# Patient Record
Sex: Female | Born: 1990 | Race: White | Hispanic: No | Marital: Married | Smoking: Never smoker
Health system: Southern US, Community
[De-identification: ages and names within clinical notes are randomized; demographics above are authoritative.]

## PROBLEM LIST (undated history)

## (undated) DIAGNOSIS — Z3400 Encounter for supervision of normal first pregnancy, unspecified trimester: Secondary | ICD-10-CM

## (undated) DIAGNOSIS — Z98891 History of uterine scar from previous surgery: Secondary | ICD-10-CM

---
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
## 2011-04-03 ENCOUNTER — Emergency Department (HOSPITAL_COMMUNITY)
Admission: EM | Admit: 2011-04-03 | Discharge: 2011-04-03 | Disposition: A | Discharge status: Home / Self Care | Payer: Self-pay | Attending: Emergency Medicine | Admitting: Emergency Medicine

## 2011-04-03 DIAGNOSIS — M546 Pain in thoracic spine: Secondary | ICD-10-CM | POA: Insufficient documentation

## 2011-04-03 DIAGNOSIS — R21 Rash and other nonspecific skin eruption: Secondary | ICD-10-CM | POA: Insufficient documentation

## 2013-12-07 LAB — OB RESULTS CONSOLE GC/CHLAMYDIA
Chlamydia: NEGATIVE
Gonorrhea: NEGATIVE

## 2013-12-07 LAB — OB RESULTS CONSOLE RUBELLA ANTIBODY, IGM: Rubella: NON-IMMUNE/NOT IMMUNE

## 2013-12-07 LAB — OB RESULTS CONSOLE HEPATITIS B SURFACE ANTIGEN: HEP B S AG: NEGATIVE

## 2013-12-07 LAB — OB RESULTS CONSOLE ANTIBODY SCREEN: ANTIBODY SCREEN: NEGATIVE

## 2013-12-07 LAB — OB RESULTS CONSOLE ABO/RH: RH Type: NEGATIVE

## 2013-12-07 LAB — OB RESULTS CONSOLE RPR: RPR: NONREACTIVE

## 2013-12-07 LAB — OB RESULTS CONSOLE HIV ANTIBODY (ROUTINE TESTING): HIV: NONREACTIVE

## 2014-06-01 LAB — OB RESULTS CONSOLE GBS: STREP GROUP B AG: POSITIVE

## 2014-06-25 ENCOUNTER — Inpatient Hospital Stay (HOSPITAL_COMMUNITY): Admission: AD | Admit: 2014-06-25 | Payer: Self-pay | Source: Ambulatory Visit | Admitting: Obstetrics and Gynecology

## 2014-06-28 ENCOUNTER — Encounter (HOSPITAL_COMMUNITY): Payer: Self-pay | Admitting: *Deleted

## 2014-06-28 ENCOUNTER — Telehealth (HOSPITAL_COMMUNITY): Payer: Self-pay | Admitting: *Deleted

## 2014-06-28 NOTE — Telephone Encounter (Signed)
Preadmission screen  

## 2014-06-29 ENCOUNTER — Encounter (HOSPITAL_COMMUNITY): Payer: Self-pay

## 2014-06-29 DIAGNOSIS — Z3400 Encounter for supervision of normal first pregnancy, unspecified trimester: Secondary | ICD-10-CM

## 2014-06-29 HISTORY — DX: Encounter for supervision of normal first pregnancy, unspecified trimester: Z34.00

## 2014-06-29 NOTE — H&P (Signed)
Anna Oconnell is a 23 y.o. female G1P0 at 55+ for IOL given post-term.  Relatively uncomplicated prenatal care except +GBBS.  +FM, no LOF, no VB, occ ctx; Rh negative - has got Rhogam.  D/w pt r/b/a of IOL, will proceed.     Maternal Medical History:  Contractions: Frequency: irregular.    Fetal activity: Perceived fetal activity is normal.    Prenatal complications: no prenatal complications Prenatal Complications - Diabetes: none.    OB History   Grav Para Term Preterm Abortions TAB SAB Ect Mult Living   1             G1 present No abn pap No STD (oral HSV)   Past Medical History  Diagnosis Date  . Normal pregnancy, first 06/29/2014   No past surgical history on file. Family History: family history is not on file. Social History:  has no tobacco, alcohol, and drug history on file.married, Art therapist Meds PNV All NKDA   Prenatal Transfer Tool  Maternal Diabetes: No Genetic Screening: Normal Maternal Ultrasounds/Referrals: Normal Fetal Ultrasounds or other Referrals:  None Maternal Substance Abuse:  No Significant Maternal Medications:  None Significant Maternal Lab Results:  Lab values include: Group B Strep positive Other Comments:  None  Review of Systems  Constitutional: Negative.   HENT: Negative.   Eyes: Negative.   Respiratory: Negative.   Cardiovascular: Negative.   Gastrointestinal: Negative.   Genitourinary: Negative.   Musculoskeletal: Negative.   Skin: Negative.   Neurological: Negative.   Psychiatric/Behavioral: Negative.       Last menstrual period 09/18/2013. Maternal Exam:  Abdomen: Fundal height is appropriate for gestation.   Estimated fetal weight is 6.5-7.5#.   Fetal presentation: vertex  Introitus: Normal vulva. Normal vagina.  Pelvis: adequate for delivery.   Cervix: Cervix evaluated by digital exam.     Physical Exam  Constitutional: She is oriented to person, place, and time. She appears well-developed and  well-nourished.  HENT:  Head: Normocephalic and atraumatic.  Cardiovascular: Normal rate and regular rhythm.   Respiratory: Effort normal and breath sounds normal. No respiratory distress. She has no wheezes.  GI: Soft. Bowel sounds are normal. She exhibits no distension. There is no tenderness.  Musculoskeletal: Normal range of motion.  Neurological: She is alert and oriented to person, place, and time.  Skin: Skin is warm and dry.  Psychiatric: She has a normal mood and affect. Her behavior is normal.    Prenatal labs: ABO, Rh: O/Negative/-- (01/27 0000) Antibody: Negative (01/27 0000) Rubella: Nonimmune (01/27 0000) RPR: Nonreactive (01/27 0000)  HBsAg: Negative (01/27 0000)  HIV: Non-reactive (01/27 0000)  GBS: Positive (07/22 0000)   Rhogam and Tdap 03/31/2014  Hgb 12.4/CF neg/Ur Cx neg/GC and Chl neg/Panorama LOW RISK, nl 1st tri [place] and AFP/glucola 125  [place] First tri cwd LMP Nl anat, ant plac, female  SVE 2/50/-2 Assessment/Plan: 23yo G1P0 at 66+  IOL with AROM after PCN and pitocin RNI - MMR PP Expect SVD  Bovard-Stuckert, Anna Oconnell 06/29/2014, 9:49 PM

## 2014-06-30 ENCOUNTER — Encounter (HOSPITAL_COMMUNITY): Payer: BC Managed Care – PPO | Admitting: Anesthesiology

## 2014-06-30 ENCOUNTER — Inpatient Hospital Stay (HOSPITAL_COMMUNITY)
Admission: RE | Admit: 2014-06-30 | Discharge: 2014-07-03 | DRG: 766 | Disposition: A | Discharge status: Home / Self Care | Payer: BC Managed Care – PPO | Source: Ambulatory Visit | Attending: Obstetrics and Gynecology | Admitting: Obstetrics and Gynecology

## 2014-06-30 ENCOUNTER — Inpatient Hospital Stay (HOSPITAL_COMMUNITY): Payer: BC Managed Care – PPO | Admitting: Anesthesiology

## 2014-06-30 ENCOUNTER — Encounter (HOSPITAL_COMMUNITY): Payer: Self-pay

## 2014-06-30 VITALS — BP 107/66 | HR 79 | Temp 98.3°F | Resp 18 | Ht 63.0 in | Wt 138.0 lb

## 2014-06-30 DIAGNOSIS — Z22330 Carrier of Group B streptococcus: Secondary | ICD-10-CM | POA: Diagnosis not present

## 2014-06-30 DIAGNOSIS — Z98891 History of uterine scar from previous surgery: Secondary | ICD-10-CM

## 2014-06-30 DIAGNOSIS — O9989 Other specified diseases and conditions complicating pregnancy, childbirth and the puerperium: Secondary | ICD-10-CM

## 2014-06-30 DIAGNOSIS — Z3403 Encounter for supervision of normal first pregnancy, third trimester: Secondary | ICD-10-CM

## 2014-06-30 DIAGNOSIS — O480 Post-term pregnancy: Principal | ICD-10-CM | POA: Diagnosis present

## 2014-06-30 DIAGNOSIS — O99892 Other specified diseases and conditions complicating childbirth: Secondary | ICD-10-CM | POA: Diagnosis present

## 2014-06-30 DIAGNOSIS — O360990 Maternal care for other rhesus isoimmunization, unspecified trimester, not applicable or unspecified: Secondary | ICD-10-CM | POA: Diagnosis present

## 2014-06-30 HISTORY — DX: Encounter for supervision of normal first pregnancy, unspecified trimester: Z34.00

## 2014-06-30 HISTORY — DX: History of uterine scar from previous surgery: Z98.891

## 2014-06-30 LAB — CBC
HEMATOCRIT: 38.5 % (ref 36.0–46.0)
Hemoglobin: 13.1 g/dL (ref 12.0–15.0)
MCH: 29.2 pg (ref 26.0–34.0)
MCHC: 34 g/dL (ref 30.0–36.0)
MCV: 85.9 fL (ref 78.0–100.0)
Platelets: 143 10*3/uL — ABNORMAL LOW (ref 150–400)
RBC: 4.48 MIL/uL (ref 3.87–5.11)
RDW: 13.9 % (ref 11.5–15.5)
WBC: 12 10*3/uL — ABNORMAL HIGH (ref 4.0–10.5)

## 2014-06-30 LAB — RPR

## 2014-06-30 MED ORDER — PHENYLEPHRINE 40 MCG/ML (10ML) SYRINGE FOR IV PUSH (FOR BLOOD PRESSURE SUPPORT): 80.0000 ug | PREFILLED_SYRINGE | INTRAVENOUS | Status: DC | PRN

## 2014-06-30 MED ORDER — LIDOCAINE HCL (PF) 1 % IJ SOLN: 30.0000 mL | INTRAMUSCULAR | Status: DC | PRN

## 2014-06-30 MED ORDER — EPHEDRINE 5 MG/ML INJ: 10.0000 mg | INTRAVENOUS | Status: DC | PRN

## 2014-06-30 MED ORDER — LACTATED RINGERS IV SOLN
INTRAVENOUS | Status: DC
  Administered 2014-06-30: 1000 mL via INTRAVENOUS
  Administered 2014-06-30 – 2014-07-01 (×3): via INTRAVENOUS

## 2014-06-30 MED ORDER — ACETAMINOPHEN 325 MG PO TABS
650.0000 mg | ORAL_TABLET | ORAL | Status: DC | PRN
  Administered 2014-06-30: 650 mg via ORAL
  Filled 2014-06-30: qty 2

## 2014-06-30 MED ORDER — BUTORPHANOL TARTRATE 1 MG/ML IJ SOLN: 1.0000 mg | INTRAMUSCULAR | Status: DC | PRN

## 2014-06-30 MED ORDER — FENTANYL 2.5 MCG/ML BUPIVACAINE 1/10 % EPIDURAL INFUSION (WH - ANES)
INTRAMUSCULAR | Status: DC | PRN
  Administered 2014-06-30: 13 mL/h via EPIDURAL

## 2014-06-30 MED ORDER — TERBUTALINE SULFATE 1 MG/ML IJ SOLN: 0.2500 mg | Freq: Once | INTRAMUSCULAR | Status: AC | PRN

## 2014-06-30 MED ORDER — PENICILLIN G POTASSIUM 5000000 UNITS IJ SOLR
2.5000 10*6.[IU] | INTRAVENOUS | Status: DC
  Administered 2014-06-30 (×3): 2.5 10*6.[IU] via INTRAVENOUS
  Filled 2014-06-30 (×5): qty 2.5

## 2014-06-30 MED ORDER — LIDOCAINE HCL (PF) 1 % IJ SOLN
INTRAMUSCULAR | Status: DC | PRN
  Administered 2014-06-30 (×2): 4 mL

## 2014-06-30 MED ORDER — LACTATED RINGERS IV SOLN: 500.0000 mL | Freq: Once | INTRAVENOUS | Status: DC

## 2014-06-30 MED ORDER — OXYTOCIN 40 UNITS IN LACTATED RINGERS INFUSION - SIMPLE MED: 62.5000 mL/h | INTRAVENOUS | Status: DC

## 2014-06-30 MED ORDER — GENTAMICIN SULFATE 40 MG/ML IJ SOLN
150.0000 mg | Freq: Three times a day (TID) | INTRAVENOUS | Status: DC
  Administered 2014-06-30: 150 mg via INTRAVENOUS
  Filled 2014-06-30 (×3): qty 3.75

## 2014-06-30 MED ORDER — FENTANYL 2.5 MCG/ML BUPIVACAINE 1/10 % EPIDURAL INFUSION (WH - ANES)
INTRAMUSCULAR | Status: AC
  Administered 2014-06-30: 12:00:00
  Filled 2014-06-30: qty 125

## 2014-06-30 MED ORDER — OXYTOCIN 40 UNITS IN LACTATED RINGERS INFUSION - SIMPLE MED
1.0000 m[IU]/min | INTRAVENOUS | Status: DC
  Administered 2014-06-30: 8 m[IU]/min via INTRAVENOUS
  Administered 2014-06-30: 4 m[IU]/min via INTRAVENOUS
  Administered 2014-06-30: 6 m[IU]/min via INTRAVENOUS
  Administered 2014-06-30: 2 m[IU]/min via INTRAVENOUS
  Administered 2014-06-30: 10 m[IU]/min via INTRAVENOUS
  Filled 2014-06-30: qty 1000

## 2014-06-30 MED ORDER — OXYCODONE-ACETAMINOPHEN 5-325 MG PO TABS: 1.0000 | ORAL_TABLET | ORAL | Status: DC | PRN

## 2014-06-30 MED ORDER — OXYTOCIN BOLUS FROM INFUSION: 500.0000 mL | INTRAVENOUS | Status: DC

## 2014-06-30 MED ORDER — PHENYLEPHRINE 40 MCG/ML (10ML) SYRINGE FOR IV PUSH (FOR BLOOD PRESSURE SUPPORT)
PREFILLED_SYRINGE | INTRAVENOUS | Status: AC
  Filled 2014-06-30: qty 10

## 2014-06-30 MED ORDER — PENICILLIN G POTASSIUM 5000000 UNITS IJ SOLR
5.0000 10*6.[IU] | Freq: Once | INTRAVENOUS | Status: AC
  Administered 2014-06-30: 5 10*6.[IU] via INTRAVENOUS
  Filled 2014-06-30: qty 5

## 2014-06-30 MED ORDER — SODIUM CHLORIDE 0.9 % IV SOLN
2.0000 g | Freq: Four times a day (QID) | INTRAVENOUS | Status: DC
  Administered 2014-06-30 – 2014-07-01 (×2): 2 g via INTRAVENOUS
  Filled 2014-06-30 (×4): qty 2000

## 2014-06-30 MED ORDER — LACTATED RINGERS IV SOLN
500.0000 mL | INTRAVENOUS | Status: DC | PRN
  Administered 2014-06-30 (×2): 300 mL via INTRAVENOUS

## 2014-06-30 MED ORDER — CITRIC ACID-SODIUM CITRATE 334-500 MG/5ML PO SOLN
30.0000 mL | ORAL | Status: DC | PRN
  Administered 2014-07-01 (×2): 30 mL via ORAL
  Filled 2014-06-30: qty 15

## 2014-06-30 MED ORDER — ONDANSETRON HCL 4 MG/2ML IJ SOLN: 4.0000 mg | Freq: Four times a day (QID) | INTRAMUSCULAR | Status: DC | PRN

## 2014-06-30 MED ORDER — FENTANYL 2.5 MCG/ML BUPIVACAINE 1/10 % EPIDURAL INFUSION (WH - ANES)
14.0000 mL/h | INTRAMUSCULAR | Status: DC | PRN
  Administered 2014-07-01: 14 mL/h via EPIDURAL
  Filled 2014-06-30 (×2): qty 125

## 2014-06-30 MED ORDER — DIPHENHYDRAMINE HCL 50 MG/ML IJ SOLN: 12.5000 mg | INTRAMUSCULAR | Status: DC | PRN

## 2014-06-30 MED ORDER — IBUPROFEN 600 MG PO TABS: 600.0000 mg | ORAL_TABLET | Freq: Four times a day (QID) | ORAL | Status: DC | PRN

## 2014-06-30 NOTE — Progress Notes (Signed)
Patient ID: Anna Oconnell, female   DOB: 1991/04/18, 23 y.o.   MRN: [id number]  Comfortable with epidural  AFVSS gen NAD FHTs 140's, category 1, good var toco q 2-93min  SVE 3.4/50/-2 AROM for light meconium, d/w pt  Continue IOL

## 2014-06-30 NOTE — Progress Notes (Signed)
Patient ID: Anna Oconnell, female   DOB: August 27, 1991, 23 y.o.   MRN: [id number]  Doing well.    AFVSS gen NAD  FHTs 150 mod var, category 1 toco irr  Await AROM for PCN IOL given postterm

## 2014-06-30 NOTE — Anesthesia Procedure Notes (Signed)
Epidural Patient location during procedure: OB Start time: 06/30/2014 12:24 PM  Staffing Anesthesiologist: Greta Yung A. Performed by: anesthesiologist   Preanesthetic Checklist Completed: patient identified, site marked, surgical consent, pre-op evaluation, timeout performed, IV checked, risks and benefits discussed and monitors and equipment checked  Epidural Patient position: sitting Prep: site prepped and draped and DuraPrep Patient monitoring: continuous pulse ox and blood pressure Approach: midline Location: L3-L4 Injection technique: LOR air  Needle:  Needle type: Tuohy  Needle gauge: 17 G Needle length: 9 cm and 9 Needle insertion depth: 4 cm Catheter type: closed end flexible Catheter size: 19 Gauge Catheter at skin depth: 9 cm Test dose: negative and Other  Assessment Events: blood not aspirated, injection not painful, no injection resistance, negative IV test and no paresthesia  Additional Notes Patient identified. Risks and benefits discussed including failed block, incomplete  Pain control, post dural puncture headache, nerve damage, paralysis, blood pressure Changes, nausea, vomiting, reactions to medications-both toxic and allergic and post Partum back pain. All questions were answered. Patient expressed understanding and wished to proceed. Sterile technique was used throughout procedure. Epidural site was Dressed with sterile barrier dressing. No paresthesias, signs of intravascular injection Or signs of intrathecal spread were encountered.  Patient was more comfortable after the epidural was dosed. Please see RN's note for documentation of vital signs and FHR which are stable.

## 2014-06-30 NOTE — Progress Notes (Signed)
Patient ID: Anna Oconnell, female   DOB: 1991-09-14, 23 y.o.   MRN: [id number]  Comfortable with epidural  AFVSS gen NAD FHTs 145 R, good var, category 1 toco Q 1-min  SVE 6cm per RN  Expect SVD

## 2014-06-30 NOTE — Anesthesia Preprocedure Evaluation (Signed)
Anesthesia Evaluation  Patient identified by MRN, date of birth, ID band Patient awake    Reviewed: Allergy & Precautions, H&P , Patient's Chart, lab work & pertinent test results  Airway Mallampati: II TM Distance: >3 FB Neck ROM: Full    Dental no notable dental hx. (+) Teeth Intact   Pulmonary neg pulmonary ROS,  breath sounds clear to auscultation  Pulmonary exam normal       Cardiovascular negative cardio ROS  Rhythm:Regular Rate:Normal     Neuro/Psych negative neurological ROS  negative psych ROS   GI/Hepatic negative GI ROS, Neg liver ROS,   Endo/Other  negative endocrine ROS  Renal/GU negative Renal ROS  negative genitourinary   Musculoskeletal negative musculoskeletal ROS (+)   Abdominal (+) + obese,   Peds  Hematology negative hematology ROS (+)   Anesthesia Other Findings   Reproductive/Obstetrics (+) Pregnancy                           Anesthesia Physical Anesthesia Plan  ASA: II  Anesthesia Plan: Epidural   Post-op Pain Management:    Induction:   Airway Management Planned: Natural Airway  Additional Equipment:   Intra-op Plan:   Post-operative Plan:   Informed Consent: I have reviewed the patients History and Physical, chart, labs and discussed the procedure including the risks, benefits and alternatives for the proposed anesthesia with the patient or authorized representative who has indicated his/her understanding and acceptance.     Plan Discussed with: Anesthesiologist  Anesthesia Plan Comments:         Anesthesia Quick Evaluation

## 2014-07-01 ENCOUNTER — Encounter (HOSPITAL_COMMUNITY)
Admission: RE | Disposition: A | Discharge status: Home / Self Care | Payer: Self-pay | Source: Ambulatory Visit | Attending: Obstetrics and Gynecology

## 2014-07-01 ENCOUNTER — Encounter (HOSPITAL_COMMUNITY): Payer: Self-pay

## 2014-07-01 DIAGNOSIS — Z98891 History of uterine scar from previous surgery: Secondary | ICD-10-CM

## 2014-07-01 HISTORY — DX: History of uterine scar from previous surgery: Z98.891

## 2014-07-01 LAB — TYPE AND SCREEN
ABO/RH(D): O NEG
ANTIBODY SCREEN: NEGATIVE

## 2014-07-01 LAB — ABO/RH: ABO/RH(D): O NEG

## 2014-07-01 SURGERY — Surgical Case
Anesthesia: Epidural | Site: Abdomen

## 2014-07-01 MED ORDER — ESMOLOL HCL 10 MG/ML IV SOLN
INTRAVENOUS | Status: AC
  Filled 2014-07-01: qty 10

## 2014-07-01 MED ORDER — MEPERIDINE HCL 25 MG/ML IJ SOLN
INTRAMUSCULAR | Status: AC
  Filled 2014-07-01: qty 1

## 2014-07-01 MED ORDER — PROMETHAZINE HCL 25 MG/ML IJ SOLN: 6.2500 mg | INTRAMUSCULAR | Status: DC | PRN

## 2014-07-01 MED ORDER — LACTATED RINGERS IV SOLN: INTRAVENOUS | Status: DC

## 2014-07-01 MED ORDER — PHENYLEPHRINE HCL 10 MG/ML IJ SOLN
INTRAMUSCULAR | Status: DC | PRN
  Administered 2014-07-01: 60 ug via INTRAVENOUS
  Administered 2014-07-01: 40 ug via INTRAVENOUS
  Administered 2014-07-01: 20 ug via INTRAVENOUS

## 2014-07-01 MED ORDER — NALOXONE HCL 1 MG/ML IJ SOLN: 1.0000 ug/kg/h | INTRAVENOUS | Status: DC | PRN

## 2014-07-01 MED ORDER — CHLOROPROCAINE HCL 3 % IJ SOLN
INTRAMUSCULAR | Status: DC | PRN
  Administered 2014-07-01: 600 mg

## 2014-07-01 MED ORDER — KETOROLAC TROMETHAMINE 30 MG/ML IJ SOLN
INTRAMUSCULAR | Status: AC
  Filled 2014-07-01: qty 1

## 2014-07-01 MED ORDER — IBUPROFEN 600 MG PO TABS: 600.0000 mg | ORAL_TABLET | Freq: Four times a day (QID) | ORAL | Status: DC | PRN

## 2014-07-01 MED ORDER — DIPHENHYDRAMINE HCL 50 MG/ML IJ SOLN: 12.5000 mg | INTRAMUSCULAR | Status: DC | PRN

## 2014-07-01 MED ORDER — SCOPOLAMINE 1 MG/3DAYS TD PT72
MEDICATED_PATCH | TRANSDERMAL | Status: AC
  Filled 2014-07-01: qty 1

## 2014-07-01 MED ORDER — SIMETHICONE 80 MG PO CHEW
80.0000 mg | CHEWABLE_TABLET | ORAL | Status: DC
  Administered 2014-07-01 – 2014-07-02 (×2): 80 mg via ORAL
  Filled 2014-07-01 (×2): qty 1

## 2014-07-01 MED ORDER — SODIUM BICARBONATE 8.4 % IV SOLN
INTRAVENOUS | Status: DC | PRN
  Administered 2014-07-01 (×2): 10 mL via EPIDURAL

## 2014-07-01 MED ORDER — MORPHINE SULFATE (PF) 0.5 MG/ML IJ SOLN
INTRAMUSCULAR | Status: DC | PRN
  Administered 2014-07-01: 350 ug via EPIDURAL
  Administered 2014-07-01: 150 ug via INTRAVENOUS

## 2014-07-01 MED ORDER — SIMETHICONE 80 MG PO CHEW
80.0000 mg | CHEWABLE_TABLET | Freq: Three times a day (TID) | ORAL | Status: DC
  Administered 2014-07-01 – 2014-07-03 (×8): 80 mg via ORAL
  Filled 2014-07-01 (×8): qty 1

## 2014-07-01 MED ORDER — SIMETHICONE 80 MG PO CHEW: 80.0000 mg | CHEWABLE_TABLET | ORAL | Status: DC | PRN

## 2014-07-01 MED ORDER — OXYCODONE-ACETAMINOPHEN 5-325 MG PO TABS
1.0000 | ORAL_TABLET | ORAL | Status: DC | PRN
  Administered 2014-07-02 – 2014-07-03 (×5): 1 via ORAL
  Filled 2014-07-01 (×5): qty 1

## 2014-07-01 MED ORDER — OXYTOCIN 10 UNIT/ML IJ SOLN
INTRAMUSCULAR | Status: AC
  Filled 2014-07-01: qty 4

## 2014-07-01 MED ORDER — MEPERIDINE HCL 25 MG/ML IJ SOLN
INTRAMUSCULAR | Status: DC | PRN
  Administered 2014-07-01: 25 mg via INTRAVENOUS

## 2014-07-01 MED ORDER — ONDANSETRON HCL 4 MG/2ML IJ SOLN
INTRAMUSCULAR | Status: DC | PRN
  Administered 2014-07-01: 4 mg via INTRAVENOUS

## 2014-07-01 MED ORDER — MEPERIDINE HCL 25 MG/ML IJ SOLN: 6.2500 mg | INTRAMUSCULAR | Status: DC | PRN

## 2014-07-01 MED ORDER — SENNOSIDES-DOCUSATE SODIUM 8.6-50 MG PO TABS
2.0000 | ORAL_TABLET | ORAL | Status: DC
  Administered 2014-07-01 – 2014-07-02 (×2): 2 via ORAL
  Filled 2014-07-01 (×2): qty 2

## 2014-07-01 MED ORDER — OXYTOCIN 10 UNIT/ML IJ SOLN
40.0000 [IU] | INTRAMUSCULAR | Status: DC | PRN
  Administered 2014-07-01: 40 [IU] via INTRAVENOUS

## 2014-07-01 MED ORDER — DIPHENHYDRAMINE HCL 50 MG/ML IJ SOLN: 25.0000 mg | INTRAMUSCULAR | Status: DC | PRN

## 2014-07-01 MED ORDER — MEASLES, MUMPS & RUBELLA VAC ~~LOC~~ INJ
0.5000 mL | INJECTION | Freq: Once | SUBCUTANEOUS | Status: AC
  Administered 2014-07-03: 0.5 mL via SUBCUTANEOUS
  Filled 2014-07-01: qty 0.5

## 2014-07-01 MED ORDER — PHENYLEPHRINE 8 MG IN D5W 100 ML (0.08MG/ML) PREMIX OPTIME
INJECTION | INTRAVENOUS | Status: AC
  Filled 2014-07-01: qty 100

## 2014-07-01 MED ORDER — LANOLIN HYDROUS EX OINT: 1.0000 "application " | TOPICAL_OINTMENT | CUTANEOUS | Status: DC | PRN

## 2014-07-01 MED ORDER — SCOPOLAMINE 1 MG/3DAYS TD PT72
1.0000 | MEDICATED_PATCH | Freq: Once | TRANSDERMAL | Status: DC
  Administered 2014-07-01: 1.5 mg via TRANSDERMAL

## 2014-07-01 MED ORDER — IBUPROFEN 800 MG PO TABS
800.0000 mg | ORAL_TABLET | Freq: Three times a day (TID) | ORAL | Status: DC
  Administered 2014-07-01 – 2014-07-03 (×6): 800 mg via ORAL
  Filled 2014-07-01 (×8): qty 1

## 2014-07-01 MED ORDER — ONDANSETRON HCL 4 MG PO TABS
4.0000 mg | ORAL_TABLET | ORAL | Status: DC | PRN
Start: 2014-07-01 — End: 2014-07-03

## 2014-07-01 MED ORDER — DIBUCAINE 1 % RE OINT
1.0000 | TOPICAL_OINTMENT | RECTAL | Status: DC | PRN
Start: 2014-07-01 — End: 2014-07-03

## 2014-07-01 MED ORDER — NALBUPHINE HCL 10 MG/ML IJ SOLN
5.0000 mg | INTRAMUSCULAR | Status: DC | PRN
  Administered 2014-07-01: 10 mg via SUBCUTANEOUS
  Filled 2014-07-01: qty 1

## 2014-07-01 MED ORDER — ACETAMINOPHEN 500 MG PO TABS
1000.0000 mg | ORAL_TABLET | Freq: Four times a day (QID) | ORAL | Status: AC
  Administered 2014-07-01 (×4): 1000 mg via ORAL
  Filled 2014-07-01 (×4): qty 2

## 2014-07-01 MED ORDER — MIDAZOLAM HCL 2 MG/2ML IJ SOLN: 0.5000 mg | Freq: Once | INTRAMUSCULAR | Status: DC | PRN

## 2014-07-01 MED ORDER — FENTANYL CITRATE 0.05 MG/ML IJ SOLN
INTRAMUSCULAR | Status: AC
  Filled 2014-07-01: qty 2

## 2014-07-01 MED ORDER — ARTIFICIAL TEARS OP OINT
TOPICAL_OINTMENT | OPHTHALMIC | Status: AC
  Filled 2014-07-01: qty 3.5

## 2014-07-01 MED ORDER — PRENATAL MULTIVITAMIN CH
1.0000 | ORAL_TABLET | Freq: Every day | ORAL | Status: DC
  Administered 2014-07-01 – 2014-07-03 (×3): 1 via ORAL
  Filled 2014-07-01 (×3): qty 1

## 2014-07-01 MED ORDER — LIDOCAINE-EPINEPHRINE (PF) 2 %-1:200000 IJ SOLN
INTRAMUSCULAR | Status: AC
  Filled 2014-07-01: qty 20

## 2014-07-01 MED ORDER — ONDANSETRON HCL 4 MG/2ML IJ SOLN: 4.0000 mg | Freq: Three times a day (TID) | INTRAMUSCULAR | Status: DC | PRN

## 2014-07-01 MED ORDER — MORPHINE SULFATE 0.5 MG/ML IJ SOLN
INTRAMUSCULAR | Status: AC
  Filled 2014-07-01: qty 10

## 2014-07-01 MED ORDER — ZOLPIDEM TARTRATE 5 MG PO TABS: 5.0000 mg | ORAL_TABLET | Freq: Every evening | ORAL | Status: DC | PRN

## 2014-07-01 MED ORDER — DIPHENHYDRAMINE HCL 25 MG PO CAPS: 25.0000 mg | ORAL_CAPSULE | Freq: Four times a day (QID) | ORAL | Status: DC | PRN

## 2014-07-01 MED ORDER — METOCLOPRAMIDE HCL 5 MG/ML IJ SOLN: 10.0000 mg | Freq: Three times a day (TID) | INTRAMUSCULAR | Status: DC | PRN

## 2014-07-01 MED ORDER — SODIUM CHLORIDE 0.9 % IJ SOLN: 3.0000 mL | INTRAMUSCULAR | Status: DC | PRN

## 2014-07-01 MED ORDER — KETOROLAC TROMETHAMINE 30 MG/ML IJ SOLN
30.0000 mg | Freq: Four times a day (QID) | INTRAMUSCULAR | Status: AC | PRN
  Administered 2014-07-01: 30 mg via INTRAMUSCULAR

## 2014-07-01 MED ORDER — WITCH HAZEL-GLYCERIN EX PADS: 1.0000 "application " | MEDICATED_PAD | CUTANEOUS | Status: DC | PRN

## 2014-07-01 MED ORDER — CHLOROPROCAINE HCL 3 % IJ SOLN
INTRAMUSCULAR | Status: AC
  Filled 2014-07-01: qty 20

## 2014-07-01 MED ORDER — FENTANYL CITRATE 0.05 MG/ML IJ SOLN
INTRAMUSCULAR | Status: DC | PRN
  Administered 2014-07-01: 100 ug via INTRAVENOUS

## 2014-07-01 MED ORDER — KETOROLAC TROMETHAMINE 30 MG/ML IJ SOLN: 30.0000 mg | Freq: Four times a day (QID) | INTRAMUSCULAR | Status: AC | PRN

## 2014-07-01 MED ORDER — DIPHENHYDRAMINE HCL 25 MG PO CAPS: 25.0000 mg | ORAL_CAPSULE | ORAL | Status: DC | PRN

## 2014-07-01 MED ORDER — ONDANSETRON HCL 4 MG/2ML IJ SOLN: 4.0000 mg | INTRAMUSCULAR | Status: DC | PRN

## 2014-07-01 MED ORDER — CEFAZOLIN SODIUM-DEXTROSE 2-3 GM-% IV SOLR
2.0000 g | INTRAVENOUS | Status: AC
  Administered 2014-07-01: 2 g via INTRAVENOUS
  Filled 2014-07-01: qty 50

## 2014-07-01 MED ORDER — ONDANSETRON HCL 4 MG/2ML IJ SOLN
INTRAMUSCULAR | Status: AC
  Filled 2014-07-01: qty 2

## 2014-07-01 MED ORDER — OXYTOCIN 40 UNITS IN LACTATED RINGERS INFUSION - SIMPLE MED: 62.5000 mL/h | INTRAVENOUS | Status: AC

## 2014-07-01 MED ORDER — MENTHOL 3 MG MT LOZG: 1.0000 | LOZENGE | OROMUCOSAL | Status: DC | PRN

## 2014-07-01 MED ORDER — NALOXONE HCL 0.4 MG/ML IJ SOLN: 0.4000 mg | INTRAMUSCULAR | Status: DC | PRN

## 2014-07-01 MED ORDER — FENTANYL CITRATE 0.05 MG/ML IJ SOLN: 25.0000 ug | INTRAMUSCULAR | Status: DC | PRN

## 2014-07-01 MED ORDER — NALBUPHINE HCL 10 MG/ML IJ SOLN: 5.0000 mg | INTRAMUSCULAR | Status: DC | PRN

## 2014-07-01 SURGICAL SUPPLY — 39 items
APL SKNCLS STERI-STRIP NONHPOA (GAUZE/BANDAGES/DRESSINGS) ×1
BENZOIN TINCTURE PRP APPL 2/3 (GAUZE/BANDAGES/DRESSINGS) ×3 IMPLANT
BLADE SURG 10 STRL SS (BLADE) ×6 IMPLANT
CLAMP CORD UMBIL (MISCELLANEOUS) IMPLANT
CLOSURE WOUND 1/2 X4 (GAUZE/BANDAGES/DRESSINGS) ×1
CLOTH BEACON ORANGE TIMEOUT ST (SAFETY) ×3 IMPLANT
CONTAINER PREFILL 10% NBF 15ML (MISCELLANEOUS) IMPLANT
DRAPE LG THREE QUARTER DISP (DRAPES) IMPLANT
DRSG OPSITE POSTOP 4X10 (GAUZE/BANDAGES/DRESSINGS) ×3 IMPLANT
DURAPREP 26ML APPLICATOR (WOUND CARE) ×3 IMPLANT
ELECT REM PT RETURN 9FT ADLT (ELECTROSURGICAL) ×3
ELECTRODE REM PT RTRN 9FT ADLT (ELECTROSURGICAL) ×1 IMPLANT
EXTRACTOR VACUUM BELL STYLE (SUCTIONS) ×6 IMPLANT
EXTRACTOR VACUUM M CUP 4 TUBE (SUCTIONS) ×2 IMPLANT
EXTRACTOR VACUUM M CUP 4' TUBE (SUCTIONS) ×1
GLOVE BIO SURGEON STRL SZ 6.5 (GLOVE) ×2 IMPLANT
GLOVE BIO SURGEONS STRL SZ 6.5 (GLOVE) ×1
GOWN STRL REUS W/TWL LRG LVL3 (GOWN DISPOSABLE) ×6 IMPLANT
KIT ABG SYR 3ML LUER SLIP (SYRINGE) ×3 IMPLANT
NEEDLE HYPO 25X5/8 SAFETYGLIDE (NEEDLE) IMPLANT
NS IRRIG 1000ML POUR BTL (IV SOLUTION) ×3 IMPLANT
PACK C SECTION WH (CUSTOM PROCEDURE TRAY) ×3 IMPLANT
PAD OB MATERNITY 4.3X12.25 (PERSONAL CARE ITEMS) ×3 IMPLANT
RTRCTR C-SECT PINK 25CM LRG (MISCELLANEOUS) ×3 IMPLANT
STAPLER VISISTAT 35W (STAPLE) IMPLANT
STRIP CLOSURE SKIN 1/2X4 (GAUZE/BANDAGES/DRESSINGS) ×1 IMPLANT
SUT MNCRL 0 VIOLET CTX 36 (SUTURE) ×2 IMPLANT
SUT MONOCRYL 0 CTX 36 (SUTURE) ×4
SUT PLAIN 1 NONE 54 (SUTURE) IMPLANT
SUT PLAIN 2 0 XLH (SUTURE) ×3 IMPLANT
SUT VIC AB 0 CT1 27 (SUTURE) ×3
SUT VIC AB 0 CT1 27XBRD ANBCTR (SUTURE) ×1 IMPLANT
SUT VIC AB 2-0 CT1 27 (SUTURE) ×3
SUT VIC AB 2-0 CT1 TAPERPNT 27 (SUTURE) ×1 IMPLANT
SUT VIC AB 4-0 KS 27 (SUTURE) ×3 IMPLANT
SYR BULB IRRIGATION 50ML (SYRINGE) ×3 IMPLANT
TOWEL OR 17X24 6PK STRL BLUE (TOWEL DISPOSABLE) ×3 IMPLANT
TRAY FOLEY CATH 14FR (SET/KITS/TRAYS/PACK) ×3 IMPLANT
WATER STERILE IRR 1000ML POUR (IV SOLUTION) ×3 IMPLANT

## 2014-07-01 NOTE — Transfer of Care (Signed)
Immediate Anesthesia Transfer of Care Note  Patient: Anna Oconnell  Procedure(s) Performed: Procedure(s): CESAREAN SECTION (N/A)  Patient Location: PACU  Anesthesia Type:Epidural  Level of Consciousness: awake, alert  and oriented  Airway & Oxygen Therapy: Patient Spontanous Breathing  Post-op Assessment: Report given to PACU RN and Post -op Vital signs reviewed and stable  Post vital signs: Reviewed and stable  Complications: No apparent anesthesia complications

## 2014-07-01 NOTE — Progress Notes (Signed)
Patient ID: GrenadaBrittany Sillas, female   DOB: 10-Oct-1991, 23 y.o.   MRN: [id number]  D/W pt slow/no change over many hours (8 at 10pm).  AFVSS gen NAD  FHTs 150's mod var, category 1 toco Q 2-413min  SVE 9/90/0-+1 RN also checked  D/W pt r/b/a of LTCS including r/b/a - including, but not limited to bleeding, infection, damage to surrounding organs, injury to infant and trouble healing,  Pt voices understanding wishes to proceed.

## 2014-07-01 NOTE — Lactation Note (Signed)
This note was copied from the chart of Anna [place]adaBrittany Narciso. Lactation Consultation Note  Patient Name: Anna Oconnell Today's Date: 07/01/2014 Reason for consult: Initial assessment Baby 10 hours of life. Mom reports that baby just spit up and baby seems sleepy at breast. Discussed with mom that this is normal. Enc to offer lots of STS and offer breasts with feeding cues. Assisted mom to latch baby in football position to right breast. Mom able to hand express colostrum. Baby is sleepy. Demonstrated waking techniques. LC allowed baby to suckle gloved finger. Baby sleepy, suckles finger gently. Enc mom to place EBM on baby's tongue. Baby opened wide and latched on deeply, suckling rhythmically with a few swallows noted. Baby was related several times. Enc mom to break seal before attempting to re-latch more deeply. Mom given [facility] brochure, aware of OP/BFSG and community resources. Enc mom to feed with cue and at least 8-12 times/24 hours. Enc mom to call out for assistance as needed.   Maternal Data Has patient been taught Hand Expression?: Yes Does the patient have breastfeeding experience prior to this delivery?: No  Feeding Feeding Type: Breast Fed Length of feed: 10 min  LATCH Score/Interventions Latch: Repeated attempts needed to sustain latch, nipple held in mouth throughout feeding, stimulation needed to elicit sucking reflex.  Audible Swallowing: A few with stimulation  Type of Nipple: Everted at rest and after stimulation  Comfort (Breast/Nipple): Soft / non-tender     Hold (Positioning): Assistance needed to correctly position infant at breast and maintain latch. Intervention(s): Breastfeeding basics reviewed;Support Pillows;Position options  LATCH Score: 7  Lactation Tools Discussed/Used     Consult Status Consult Status: Follow-up Date: 07/02/14 Follow-up type: In-patient    Geralynn OchsWILLIARD, Caelum Federici 07/01/2014, 4:04 PM

## 2014-07-01 NOTE — Op Note (Signed)
Anna Oconnell, MESCH NO.:  [id number]  MEDICAL RECORD NO.:  [id number]  LOCATION:  [facility]                          FACILITY:  [facility]  PHYSICIAN:  Sherron Monday, MD        DATE OF BIRTH:  1991/07/24  DATE OF PROCEDURE:  06/30/2014 DATE OF DISCHARGE:                              OPERATIVE REPORT   PREOPERATIVE DIAGNOSIS:  Intrauterine pregnancy at term.  Arrest of dilatation.  POSTOPERATIVE DIAGNOSIS:  Intrauterine pregnancy at term.  Arrest of dilatation, delivered.  PROCEDURE:  Primary low transverse cesarean section.  SURGEON:  Sherron Monday, MD  ASSISTANT:  None.  ANESTHESIA:  Epidural and Nesacaine for local anesthesia.  EBL:  700 mL.  URINE OUTPUT:  950 mL clear urine at the end of the procedure.  IV FLUIDS:  1000 mL.  COMPLICATIONS:  None.  PATHOLOGY:  Placenta to L and D.  FINDINGS:  Viable female infant at 5:40 a.m. with Apgars and weight pending at the time of dictation.  Normal uterus, tubes, and ovaries postpartum.  DESCRIPTION OF PROCEDURE:  After informed consent was reviewed with the patient including risks, benefits, and alternatives of the surgical procedure, she was transported to the OR, where her epidural was dosed and found to be adequate.  She was in supine position with a leftward tilt.  A Pfannenstiel skin incision was made approximately 2 fingerbreadths above the pubic symphysis and carried through the underlying layer of fascia sharply.  The fascia was incised in the midline and the incision was extended laterally with Mayo scissors.  The superior aspect of the fascial incision was grasped with Kocher clamps. The rectus muscles were dissected off both bluntly and sharply.  Midline was easily identified.  The peritoneum was entered bluntly.  The incision was extended superiorly and inferiorly with good visualization of the bladder.  Alexis skin retractor was placed carefully making sure no bowel was entrapped.  The uterus was  inspected, incised in a transverse fashion.  The infant was delivered with some difficulty with the aid of a vacuum.  Nose and mouth were suctioned on the field.  Cord was clamped and cut.  The infant was handed off to the awaiting pediatric staff.  The cord gases were collected and are now pending. The placenta was expressed.  Uterus was cleared of all clot and debris. The uterine incision was closed in 2 layers of 0 Monocryl the first of which was running locked and the second as an imbricating layer.  This was noted to be hemostatic.  The gutters were cleared of all clot and debris.  The peritoneum was reapproximated with 2-0 Vicryl in a running fashion.  The subfascial planes were inspected and found to be hemostatic.  The fascia was closed with 0 Vicryl in a single suture. The subcuticular adipose layer was made hemostatic with Bovie cautery.  The dead space was closed with 3-0 plain gut.  The skin was closed with 4-0 Vicryl on a Keith needle in a subcuticular fashion.  Benzoin and Steri- Strips were applied.  Sponge, lap, and needle count was correct x2 per the operating room staff.  The patient tolerated the procedure well.     Sherron Monday, MD  JB/MEDQ  D:  07/01/2014  T:  07/01/2014  Job:  [id number]

## 2014-07-01 NOTE — Brief Op Note (Signed)
06/30/2014 - 07/01/2014  6:22 AM  PATIENT:  Anna Oconnell  23 y.o. female  PRE-OPERATIVE DIAGNOSIS:  Arrest of dilitation  POST-OPERATIVE DIAGNOSIS:  same  PROCEDURE:  Procedure(s): CESAREAN SECTION (N/A)  SURGEON:  Surgeon(s) and Role:    * Elvera Almario Bovard-Stuckert, MD - Primary  ANESTHESIA:   local and epidural  EBL:  Total I/O In: 1000 [I.V.:1000] Out: 1650 [Urine:950; Blood:700]  FINDINGS: viable female infant at 5:40am apgars P, weight P, nl PP anatomy  BLOOD ADMINISTERED:none  DRAINS: Urinary Catheter (Foley)   LOCAL MEDICATIONS USED:  OTHER Nesicaine  SPECIMEN:  Source of Specimen:  Placenta  DISPOSITION OF SPECIMEN:  L&D  COUNTS:  YES  TOURNIQUET:  * No tourniquets in log *  DICTATION: .Other Dictation: Dictation Number [id number]  PLAN OF CARE: Admit to inpatient   PATIENT DISPOSITION:  PACU - hemodynamically stable.   Delay start of Pharmacological VTE agent (>24hrs) due to surgical blood loss or risk of bleeding: not applicable

## 2014-07-01 NOTE — Progress Notes (Signed)
ANTIBIOTIC CONSULT NOTE - INITIAL  Pharmacy Consult for Gentamicin Indication: Chorioamnionitis   No Known Allergies  Patient Measurements: Height: 5\' 3"  (160 cm) Weight: 138 lb (62.596 kg) IBW/kg (Calculated) : 52.4 Adjusted Body Weight: 55.5  Vital Signs: Temp: 98 F (36.7 C) (08/21 0431) Temp src: Oral (08/21 0431) BP: 127/80 mmHg (08/21 0432) Pulse Rate: 96 (08/21 0432)  Labs:  Recent Labs  06/30/14 0725  WBC 12.0*  HGB 13.1  PLT 143*   No results found for this basename: GENTTROUGH, GENTPEAK, GENTRANDOM,  in the last 72 hours   Microbiology: No results found for this or any previous visit (from the past 720 hour(s)).  Medications:  Ampicillin 2 grams Q 6 hr  Assessment: 23 y.o. female G1P0 at 4088w6d for IOL. +GBS.  Estimated Ke = 0.327, Vd = 0.4  Goal of Therapy:  Gentamicin peak 6-8 mg/L and Trough < 1 mg/L  Plan:   Gentamicin 150 mg IV every 8 hrs  Check Scr with next labs if gentamicin continued. Will check gentamicin levels if continued > 72hr or clinically indicated.  Rockney Grenz Scarlett 07/01/2014,5:00 AM

## 2014-07-01 NOTE — Anesthesia Postprocedure Evaluation (Signed)
  Anesthesia Post-op Note  Patient: Anna Oconnell  Procedure(s) Performed: Procedure(s): CESAREAN SECTION (N/A)  Patient Location: Mother/Baby  Anesthesia Type:Epidural  Level of Consciousness: awake, alert  and oriented  Airway and Oxygen Therapy: Patient Spontanous Breathing  Post-op Pain: none  Post-op Assessment: Post-op Vital signs reviewed, Patient's Cardiovascular Status Stable, Respiratory Function Stable, No headache, No backache, No residual numbness and No residual motor weakness  Post-op Vital Signs: Reviewed and stable  Last Vitals:  Filed Vitals:   07/01/14 1654  BP: 88/48  Pulse: 90  Temp: 36.7 C  Resp: 18    Complications: No apparent anesthesia complications

## 2014-07-02 LAB — CBC
HEMATOCRIT: 31.9 % — AB (ref 36.0–46.0)
Hemoglobin: 10.7 g/dL — ABNORMAL LOW (ref 12.0–15.0)
MCH: 29.2 pg (ref 26.0–34.0)
MCHC: 33.5 g/dL (ref 30.0–36.0)
MCV: 87.2 fL (ref 78.0–100.0)
PLATELETS: 122 10*3/uL — AB (ref 150–400)
RBC: 3.66 MIL/uL — ABNORMAL LOW (ref 3.87–5.11)
RDW: 14.5 % (ref 11.5–15.5)
WBC: 14.3 10*3/uL — ABNORMAL HIGH (ref 4.0–10.5)

## 2014-07-02 LAB — BIRTH TISSUE RECOVERY COLLECTION (PLACENTA DONATION)

## 2014-07-02 NOTE — Progress Notes (Signed)
Patient ID: Anna Oconnell, female   DOB: [birth year], 23 y.o.   MRN: [id number] #1 afebrile BP normal HGB stable No complaints. Tolerating a diet, passing flatus, voiding well and ambulating well.

## 2014-07-03 MED ORDER — MEASLES, MUMPS & RUBELLA VAC ~~LOC~~ INJ
0.5000 mL | INJECTION | Freq: Once | SUBCUTANEOUS | Status: DC
  Filled 2014-07-03: qty 0.5

## 2014-07-03 MED ORDER — OXYCODONE-ACETAMINOPHEN 5-325 MG PO TABS: 1.0000 | ORAL_TABLET | Freq: Four times a day (QID) | ORAL | Status: DC | PRN

## 2014-07-03 MED ORDER — IBUPROFEN 600 MG PO TABS: 600.0000 mg | ORAL_TABLET | Freq: Four times a day (QID) | ORAL | Status: DC | PRN

## 2014-07-03 NOTE — Discharge Summary (Signed)
[name], Anna Oconnell NO.:  [id number]  MEDICAL RECORD NO.:  [id number]  LOCATION:  9102                          [facility]ACILITY:  [facility]  PHYSICIAN:  Malachi Pro. Ambrose Mantle, M.D. DATE OF BIRTH:  [birth year]  DATE OF ADMISSION:  06/30/2014 DATE OF DISCHARGE:  07/03/2014                              DISCHARGE SUMMARY   A 24 year old white female, para 0, gravida 1, at 40+ weeks gestation, admitted for induction of labor.  Blood group and type O negative, negative antibody.  Rubella nonimmune.  RPR nonreactive.  Hepatitis B surface antigen negative, HIV negative, group B strep positive.  Low risk 1st trimester screen.  One-hour Glucola 125.  AFP normal.  After admission to the hospital, the patient was started on Pitocin.  She received an epidural.  At 1 p.m., she was 3 cm to 4 cm, 50%, vertex at - 2.  By 7:40 p.m., she was 6 cm.  At 5:02 a.m. on July 01, 2014, she was 9 cm, possible arrest of dilatation.  She was taken the OR, underwent a low-transverse cervical C-section by Dr. Ellyn Hack.  Delivery of a healthy infant.  Postoperatively, the patient did very well.  Had no particular problems and on the second postop day, she was afebrile, doing well, passing flatus, tolerating a diet, ambulating well, voiding well, and was ready for discharge.  LABORATORY DATA:  Initial hemoglobin 13.1, hematocrit 38.5, white count 12,000, platelet count of 143,000.  Followup hemoglobin 10.7, platelet count of 122,000.  RPR was nonreactive.  FINAL DIAGNOSES:  Intrauterine pregnancy 40+ weeks, delivered by C- section.  Arrest of dilatation.  OPERATION:  Low-transverse cervical C-section.  FINAL CONDITION:  Improved.  INSTRUCTIONS:  Include our regular discharge instruction booklet and after visit summary.  Prescriptions for Motrin 600 mg 30 tablets, 1 every 6 hours as needed for pain and Percocet 5/325, 30 tablets, 1 every 6 hours as needed for pain.  She is to return to the office in 2  weeks for followup examination with Dr. Ellyn Hack.  She is also to schedule the baby circumcision.  She has already paid for it to have it done in the office.  She was given an option of having it done here in the hospital, but she elects to have it done in the office.     Malachi Pro. Ambrose Mantle, M.D.     TFH/MEDQ  D:  07/03/2014  T:  07/03/2014  Job:  [id number]

## 2014-07-03 NOTE — Discharge Instructions (Signed)
booklet °

## 2014-07-03 NOTE — Progress Notes (Signed)
Patient ID: Anna Oconnell, female   DOB: 1991-01-07, 23 y.o.   MRN: [id number] #[id number] afebrile doing well for d/c

## 2014-07-03 NOTE — Lactation Note (Addendum)
This note was copied from the chart of Anna [place] Garcilazo. Lactation Consultation Note  Patient Name: Anna Oconnell [id number] Date: 07/03/2014 Reason for consult: Follow-up assessment Per mom the baby recently fed at the breast. Mom had many Breast feeding questions. LC reviewed basics , prevention and tx of sore nipples and engorgement. Mom has a DEBP Medela and LC set up and instructed. Mom receptive to teaching and review. Instructed on the use comfort gels for tender nipples. Mother informed of post-discharge support and given phone number to the lactation department, including services  for phone call assistance; out-patient appointments; and breastfeeding support group. List of other breastfeeding resources  in the community given in the handout. Encouraged mother to call for problems or concerns related to breastfeeding.   Maternal Data    Feeding Feeding Type:  (per mom recently fed at 1400 ) Length of feed: 15 min  LATCH Score/Interventions       Type of Nipple:  (per mom nipples tender , instructed comfort gels )        Intervention(s): Breastfeeding basics reviewed     Lactation Tools Discussed/Used WIC Program: No Pump Review: Setup, frequency, and cleaning;Milk Storage Initiated by:: MAI  Date initiated:: 07/03/14   Consult Status Consult Status: Complete Date: 07/03/14 Follow-up type: In-patient    Kathrin Greathouse 07/03/2014, 3:13 PM

## 2014-07-04 ENCOUNTER — Encounter (HOSPITAL_COMMUNITY): Payer: Self-pay | Admitting: Obstetrics and Gynecology

## 2014-09-12 ENCOUNTER — Encounter (HOSPITAL_COMMUNITY): Payer: Self-pay | Admitting: Obstetrics and Gynecology

## 2015-09-04 LAB — OB RESULTS CONSOLE ABO/RH: RH Type: NEGATIVE

## 2015-09-04 LAB — OB RESULTS CONSOLE HEPATITIS B SURFACE ANTIGEN: HEP B S AG: NEGATIVE

## 2015-09-04 LAB — OB RESULTS CONSOLE GC/CHLAMYDIA
CHLAMYDIA, DNA PROBE: NEGATIVE
GC PROBE AMP, GENITAL: NEGATIVE

## 2015-09-04 LAB — OB RESULTS CONSOLE HIV ANTIBODY (ROUTINE TESTING): HIV: NONREACTIVE

## 2015-09-04 LAB — OB RESULTS CONSOLE RUBELLA ANTIBODY, IGM: Rubella: IMMUNE

## 2015-09-04 LAB — OB RESULTS CONSOLE ANTIBODY SCREEN: ANTIBODY SCREEN: NEGATIVE

## 2015-11-12 NOTE — L&D Delivery Note (Signed)
Delivery Note Pt reached complete dilation and pushed a little over an hour bringing the vertex to a +3 station.  Despite much coaching, she felt she could not push any longer so was consented for a vacuum assisted delivery.  The risks of vacuum delivery were d/w pt and husband including, cephalohemotoma or other injuries.  With the bladder empty, the kiwi vacuum was applied to LOA presentation +3 station and inflated to green zone.  In one set of pulls the vertex was brought to crowning and delivered.  The remainder of the body delivered without problem.  At 10:00 AM a healthy female was delivered via VBAC, Vacuum Assisted (Presentation: ; Occiput Anterior).  APGAR: 9, 9; weight pending .   Placenta status: Intact, Spontaneous.  Cord: 3 vessels with the following complications: None.  Anesthesia: Epidural  Episiotomy: None Lacerations: First degree;Vaginal and left labial Suture Repair: 3.0 vicryl rapide Est. Blood Loss (mL): 225ml  Mom to postpartum.  Baby to Couplet care / Skin to Skin.  D/w pt BTL process in detail.  We reviewed the procedure and she has decided she does not want to proceed.  Husband plans vasectomy D/w pt and husband circumcision and they desire to proceed.  Oliver PilaRICHARDSON,Anna Oconnell W 03/22/2016, 10:35 AM

## 2016-01-08 LAB — OB RESULTS CONSOLE RPR: RPR: NONREACTIVE

## 2016-02-29 LAB — OB RESULTS CONSOLE GBS: GBS: POSITIVE

## 2016-03-21 ENCOUNTER — Inpatient Hospital Stay (EMERGENCY_DEPARTMENT_HOSPITAL)
Admission: AD | Admit: 2016-03-21 | Discharge: 2016-03-21 | Disposition: A | Discharge status: Home / Self Care | Payer: 59 | Source: Ambulatory Visit | Attending: Obstetrics and Gynecology | Admitting: Obstetrics and Gynecology

## 2016-03-21 ENCOUNTER — Encounter (HOSPITAL_COMMUNITY): Payer: Self-pay

## 2016-03-21 DIAGNOSIS — O26893 Other specified pregnancy related conditions, third trimester: Secondary | ICD-10-CM

## 2016-03-21 DIAGNOSIS — N898 Other specified noninflammatory disorders of vagina: Secondary | ICD-10-CM

## 2016-03-21 LAB — AMNISURE RUPTURE OF MEMBRANE (ROM) NOT AT ARMC: Amnisure ROM: NEGATIVE

## 2016-03-21 NOTE — MAU Note (Signed)
Notified provider that patient came in for PROM but amnisure was negative. Fetus active. Provider said patient is ok to be discharged with labor precautions.

## 2016-03-21 NOTE — Discharge Instructions (Signed)
Fetal Movement Counts Patient Name: __________________________________________________ Patient Due Date: ____________________ Performing a fetal movement count is highly recommended in high-risk pregnancies, but it is good for every pregnant woman to do. Your health care provider may ask you to start counting fetal movements at 28 weeks of the pregnancy. Fetal movements often increase:  After eating a full meal.  After physical activity.  After eating or drinking something sweet or cold.  At rest. Pay attention to when you feel the baby is most active. This will help you notice a pattern of your baby's sleep and wake cycles and what factors contribute to an increase in fetal movement. It is important to perform a fetal movement count at the same time each day when your baby is normally most active.  HOW TO COUNT FETAL MOVEMENTS 1. Find a quiet and comfortable area to sit or lie down on your left side. Lying on your left side provides the best blood and oxygen circulation to your baby. 2. Write down the day and time on a sheet of paper or in a journal. 3. Start counting kicks, flutters, swishes, rolls, or jabs in a 2-hour period. You should feel at least 10 movements within 2 hours. 4. If you do not feel 10 movements in 2 hours, wait 2-3 hours and count again. Look for a change in the pattern or not enough counts in 2 hours. SEEK MEDICAL CARE IF:  You feel less than 10 counts in 2 hours, tried twice.  There is no movement in over an hour.  The pattern is changing or taking longer each day to reach 10 counts in 2 hours.  You feel the baby is not moving as he or she usually does. Date: ____________ Movements: ____________ Start time: ____________ Finish time: ____________  Date: ____________ Movements: ____________ Start time: ____________ Finish time: ____________ Date: ____________ Movements: ____________ Start time: ____________ Finish time: ____________ Date: ____________ Movements:  ____________ Start time: ____________ Finish time: ____________ Date: ____________ Movements: ____________ Start time: ____________ Finish time: ____________ Date: ____________ Movements: ____________ Start time: ____________ Finish time: ____________ Date: ____________ Movements: ____________ Start time: ____________ Finish time: ____________ Date: ____________ Movements: ____________ Start time: ____________ Finish time: ____________  Date: ____________ Movements: ____________ Start time: ____________ Finish time: ____________ Date: ____________ Movements: ____________ Start time: ____________ Finish time: ____________ Date: ____________ Movements: ____________ Start time: ____________ Finish time: ____________ Date: ____________ Movements: ____________ Start time: ____________ Finish time: ____________ Date: ____________ Movements: ____________ Start time: ____________ Finish time: ____________ Date: ____________ Movements: ____________ Start time: ____________ Finish time: ____________ Date: ____________ Movements: ____________ Start time: ____________ Finish time: ____________  Date: ____________ Movements: ____________ Start time: ____________ Finish time: ____________ Date: ____________ Movements: ____________ Start time: ____________ Finish time: ____________ Date: ____________ Movements: ____________ Start time: ____________ Finish time: ____________ Date: ____________ Movements: ____________ Start time: ____________ Finish time: ____________ Date: ____________ Movements: ____________ Start time: ____________ Finish time: ____________ Date: ____________ Movements: ____________ Start time: ____________ Finish time: ____________ Date: ____________ Movements: ____________ Start time: ____________ Finish time: ____________  Date: ____________ Movements: ____________ Start time: ____________ Finish time: ____________ Date: ____________ Movements: ____________ Start time: ____________ Finish  time: ____________ Date: ____________ Movements: ____________ Start time: ____________ Finish time: ____________ Date: ____________ Movements: ____________ Start time: ____________ Finish time: ____________ Date: ____________ Movements: ____________ Start time: ____________ Finish time: ____________ Date: ____________ Movements: ____________ Start time: ____________ Finish time: ____________ Date: ____________ Movements: ____________ Start time: ____________ Finish time: ____________  Date: ____________ Movements: ____________ Start time: ____________ Finish   time: ____________ Date: ____________ Movements: ____________ Start time: ____________ Finish time: ____________ Date: ____________ Movements: ____________ Start time: ____________ Finish time: ____________ Date: ____________ Movements: ____________ Start time: ____________ Finish time: ____________ Date: ____________ Movements: ____________ Start time: ____________ Finish time: ____________ Date: ____________ Movements: ____________ Start time: ____________ Finish time: ____________ Date: ____________ Movements: ____________ Start time: ____________ Finish time: ____________  Date: ____________ Movements: ____________ Start time: ____________ Finish time: ____________ Date: ____________ Movements: ____________ Start time: ____________ Finish time: ____________ Date: ____________ Movements: ____________ Start time: ____________ Finish time: ____________ Date: ____________ Movements: ____________ Start time: ____________ Finish time: ____________ Date: ____________ Movements: ____________ Start time: ____________ Finish time: ____________ Date: ____________ Movements: ____________ Start time: ____________ Finish time: ____________ Date: ____________ Movements: ____________ Start time: ____________ Finish time: ____________  Date: ____________ Movements: ____________ Start time: ____________ Finish time: ____________ Date: ____________  Movements: ____________ Start time: ____________ Finish time: ____________ Date: ____________ Movements: ____________ Start time: ____________ Finish time: ____________ Date: ____________ Movements: ____________ Start time: ____________ Finish time: ____________ Date: ____________ Movements: ____________ Start time: ____________ Finish time: ____________ Date: ____________ Movements: ____________ Start time: ____________ Finish time: ____________ Date: ____________ Movements: ____________ Start time: ____________ Finish time: ____________  Date: ____________ Movements: ____________ Start time: ____________ Finish time: ____________ Date: ____________ Movements: ____________ Start time: ____________ Finish time: ____________ Date: ____________ Movements: ____________ Start time: ____________ Finish time: ____________ Date: ____________ Movements: ____________ Start time: ____________ Finish time: ____________ Date: ____________ Movements: ____________ Start time: ____________ Finish time: ____________ Date: ____________ Movements: ____________ Start time: ____________ Finish time: ____________   This information is not intended to replace advice given to you by your health care provider. Make sure you discuss any questions you have with your health care provider.   Document Released: 11/27/2006 Document Revised: 11/18/2014 Document Reviewed: 08/24/2012 Elsevier Interactive Patient Education 2016 [employer] Braxton Hicks Contractions Contractions of the uterus can occur throughout pregnancy. Contractions are not always a sign that you are in labor.  WHAT ARE BRAXTON HICKS CONTRACTIONS?  Contractions that occur before labor are called Braxton Hicks contractions, or false labor. Toward the end of pregnancy (32-34 weeks), these contractions can develop more often and may become more forceful. This is not true labor because these contractions do not result in opening (dilatation) and thinning of  the cervix. They are sometimes difficult to tell apart from true labor because these contractions can be forceful and people have different pain tolerances. You should not feel embarrassed if you go to the hospital with false labor. Sometimes, the only way to tell if you are in true labor is for your health care provider to look for changes in the cervix. If there are no prenatal problems or other health problems associated with the pregnancy, it is completely safe to be sent home with false labor and await the onset of true labor. HOW CAN YOU TELL THE DIFFERENCE BETWEEN TRUE AND FALSE LABOR? False Labor  The contractions of false labor are usually shorter and not as hard as those of true labor.   The contractions are usually irregular.   The contractions are often felt in the front of the lower abdomen and in the groin.   The contractions may go away when you walk around or change positions while lying down.   The contractions get weaker and are shorter lasting as time goes on.   The contractions do not usually become progressively stronger, regular, and closer together as with true labor.  True Labor 5. Contractions in true   labor last 30-70 seconds, become very regular, usually become more intense, and increase in frequency.  6. The contractions do not go away with walking.  7. The discomfort is usually felt in the top of the uterus and spreads to the lower abdomen and low back.  8. True labor can be determined by your health care provider with an exam. This will show that the cervix is dilating and getting thinner.  WHAT TO REMEMBER  Keep up with your usual exercises and follow other instructions given by your health care provider.   Take medicines as directed by your health care provider.   Keep your regular prenatal appointments.   Eat and drink lightly if you think you are going into labor.   If Braxton Hicks contractions are making you uncomfortable:   Change  your position from lying down or resting to walking, or from walking to resting.   Sit and rest in a tub of warm water.   Drink 2-3 glasses of water. Dehydration may cause these contractions.   Do slow and deep breathing several times an hour.  WHEN SHOULD I SEEK IMMEDIATE MEDICAL CARE? Seek immediate medical care if:  Your contractions become stronger, more regular, and closer together.   You have fluid leaking or gushing from your vagina.   You have a fever.   You pass blood-tinged mucus.   You have vaginal bleeding.   You have continuous abdominal pain.   You have low back pain that you never had before.   You feel your baby's head pushing down and causing pelvic pressure.   Your baby is not moving as much as it used to.    This information is not intended to replace advice given to you by your health care provider. Make sure you discuss any questions you have with your health care provider.   Document Released: 10/28/2005 Document Revised: 11/02/2013 Document Reviewed: 08/09/2013 Elsevier Interactive Patient Education 2016 Elsevier Inc[employer]  

## 2016-03-21 NOTE — MAU Note (Signed)
Patient presents with PROM. Patient states she had a gush at 10am this morning and another since but has not had to wear a pad since. Patient denies ctx or bleeding. Fetus active.

## 2016-03-21 NOTE — MAU Provider Note (Signed)
History   [id number]   Chief Complaint  Patient presents with  . Rupture of Membranes    HPI Anna Oconnell is a 25 y.o. female  G2P1001 here with report of watery vaginal discharge that began at approximately 10am. Reports 2 gushes of watery fluid since then. Leaking of fluid has since stopped. Pt reports occasional contractions and denies vaginal bleeding. Last intercourse several weeks ago.   +fetal movement. All other systems negative.   Previous c/section; is planning to [facility].  Patient's last menstrual period was 06/24/2015.  OB History  Gravida Para Term Preterm AB SAB TAB Ectopic Multiple Living  # Outcome Date GA Lbr Len/2nd Weight Sex Delivery Anes PTL Lv  2 Current           1 Term 07/01/14 [redacted]w[redacted]d  8 lb 14.7 oz (4.045 kg) M CS-LTranv EPI  Y      Past Medical History  Diagnosis Date  . Normal pregnancy, first 06/29/2014  . S/P cesarean section 07/01/2014    History reviewed. No pertinent family history.  Social History   Social History  . Marital Status: Married    Spouse Name: N/A  . Number of Children: N/A  . Years of Education: N/A   Social History Main Topics  . Smoking status: Never Smoker   . Smokeless tobacco: None  . Alcohol Use: No  . Drug Use: None  . Sexual Activity: Yes   Other Topics Concern  . None   Social History Narrative    No Known Allergies  No current facility-administered medications on file prior to encounter.   Current Outpatient Prescriptions on File Prior to Encounter  Medication Sig Dispense Refill  . ibuprofen (ADVIL,MOTRIN) 600 MG tablet Take 1 tablet (600 mg total) by mouth every 6 (six) hours as needed for mild pain. 30 tablet 0  . oxyCODONE-acetaminophen (PERCOCET/ROXICET) 5-325 MG per tablet Take 1 tablet by mouth every 6 (six) hours as needed for severe pain (moderate - severe pain). 30 tablet 0  . Prenatal Vit-Fe Fumarate-FA (PRENATAL MULTIVITAMIN) TABS tablet Take 1 tablet by mouth daily at  12 noon.       Review of Systems  Gastrointestinal: Negative for abdominal pain.  Genitourinary: Positive for vaginal discharge. Negative for vaginal bleeding.     Physical Exam   Filed Vitals:   03/21/16 1829  BP: 120/77  Pulse: 87  Temp: 98.1 F (36.7 C)  TempSrc: Oral  Resp: 18    Physical Exam  Nursing note and vitals reviewed. Constitutional: She is oriented to person, place, and time. She appears well-developed and well-nourished. No distress.  HENT:  Head: Normocephalic and atraumatic.  Eyes: Conjunctivae are normal. Right eye exhibits no discharge. Left eye exhibits no discharge. No scleral icterus.  Neck: Normal range of motion.  Cardiovascular: Normal rate.   Respiratory: Effort normal. No respiratory distress.  Genitourinary: Vaginal discharge (small amount of yellow mucoid discharge. No pooling) found.  Neurological: She is alert and oriented to person, place, and time.  Skin: Skin is warm and dry. She is not diaphoretic.  Psychiatric: She has a normal mood and affect. Her behavior is normal. Judgment and thought content normal.   Fetal Tracing:  Baseline: 140 Variability: moderate Accelerations: 15x15 Decelerations: none  Toco: irregular, palpate mild, pt doesn't feel  Dilation: 3 Effacement (%): 50 Cervical Position: Posterior Station: -3 Presentation: Vertex Exam by:: Kayren Eaves RN   MAU Course  Procedures  MDM Reactive tracing No pooling, fern negative Amnisure  Assessment and Plan  A: 1. Vaginal discharge during pregnancy in third trimester     P: Discharge home Labor precautions   Judeth HornErin Ahyan Kreeger, NP 03/21/2016 7:02 PM

## 2016-03-22 ENCOUNTER — Inpatient Hospital Stay (HOSPITAL_COMMUNITY): Payer: 59 | Admitting: Anesthesiology

## 2016-03-22 ENCOUNTER — Encounter (HOSPITAL_COMMUNITY): Payer: Self-pay

## 2016-03-22 ENCOUNTER — Inpatient Hospital Stay (HOSPITAL_COMMUNITY)
Admission: AD | Admit: 2016-03-22 | Discharge: 2016-03-24 | DRG: 775 | Disposition: A | Discharge status: Home / Self Care | Payer: 59 | Source: Ambulatory Visit | Attending: Obstetrics and Gynecology | Admitting: Obstetrics and Gynecology

## 2016-03-22 DIAGNOSIS — O99824 Streptococcus B carrier state complicating childbirth: Secondary | ICD-10-CM | POA: Diagnosis present

## 2016-03-22 DIAGNOSIS — IMO0001 Reserved for inherently not codable concepts without codable children: Secondary | ICD-10-CM

## 2016-03-22 DIAGNOSIS — O34211 Maternal care for low transverse scar from previous cesarean delivery: Secondary | ICD-10-CM | POA: Diagnosis present

## 2016-03-22 DIAGNOSIS — Z3A38 38 weeks gestation of pregnancy: Secondary | ICD-10-CM

## 2016-03-22 DIAGNOSIS — O34219 Maternal care for unspecified type scar from previous cesarean delivery: Secondary | ICD-10-CM | POA: Diagnosis present

## 2016-03-22 LAB — CBC
HEMATOCRIT: 35.8 % — AB (ref 36.0–46.0)
Hemoglobin: 12.2 g/dL (ref 12.0–15.0)
MCH: 29 pg (ref 26.0–34.0)
MCHC: 34.1 g/dL (ref 30.0–36.0)
MCV: 85.2 fL (ref 78.0–100.0)
Platelets: 162 10*3/uL (ref 150–400)
RBC: 4.2 MIL/uL (ref 3.87–5.11)
RDW: 14.4 % (ref 11.5–15.5)
WBC: 20.6 10*3/uL — AB (ref 4.0–10.5)

## 2016-03-22 LAB — RPR: RPR: NONREACTIVE

## 2016-03-22 LAB — TYPE AND SCREEN
ABO/RH(D): O NEG
Antibody Screen: NEGATIVE

## 2016-03-22 MED ORDER — PENICILLIN G POTASSIUM 5000000 UNITS IJ SOLR: 2.5000 10*6.[IU] | INTRAMUSCULAR | Status: DC

## 2016-03-22 MED ORDER — ZOLPIDEM TARTRATE 5 MG PO TABS: 5.0000 mg | ORAL_TABLET | Freq: Every evening | ORAL | Status: DC | PRN

## 2016-03-22 MED ORDER — OXYCODONE-ACETAMINOPHEN 5-325 MG PO TABS: 1.0000 | ORAL_TABLET | ORAL | Status: DC | PRN

## 2016-03-22 MED ORDER — COCONUT OIL OIL
1.0000 "application " | TOPICAL_OIL | Status: DC | PRN
  Administered 2016-03-23: 1 via TOPICAL
  Filled 2016-03-22: qty 120

## 2016-03-22 MED ORDER — ACETAMINOPHEN 325 MG PO TABS: 650.0000 mg | ORAL_TABLET | ORAL | Status: DC | PRN

## 2016-03-22 MED ORDER — CITRIC ACID-SODIUM CITRATE 334-500 MG/5ML PO SOLN: 30.0000 mL | ORAL | Status: DC | PRN

## 2016-03-22 MED ORDER — FENTANYL 2.5 MCG/ML BUPIVACAINE 1/10 % EPIDURAL INFUSION (WH - ANES)
INTRAMUSCULAR | Status: AC
  Administered 2016-03-22: 03:00:00
  Filled 2016-03-22: qty 125

## 2016-03-22 MED ORDER — DIPHENHYDRAMINE HCL 50 MG/ML IJ SOLN: 12.5000 mg | INTRAMUSCULAR | Status: DC | PRN

## 2016-03-22 MED ORDER — IBUPROFEN 600 MG PO TABS
600.0000 mg | ORAL_TABLET | Freq: Four times a day (QID) | ORAL | Status: DC
  Administered 2016-03-22 – 2016-03-24 (×9): 600 mg via ORAL
  Filled 2016-03-22 (×9): qty 1

## 2016-03-22 MED ORDER — LACTATED RINGERS IV SOLN: 500.0000 mL | Freq: Once | INTRAVENOUS | Status: DC

## 2016-03-22 MED ORDER — SENNOSIDES-DOCUSATE SODIUM 8.6-50 MG PO TABS
2.0000 | ORAL_TABLET | ORAL | Status: DC
  Administered 2016-03-23 – 2016-03-24 (×2): 2 via ORAL
  Filled 2016-03-22 (×2): qty 2

## 2016-03-22 MED ORDER — OXYCODONE-ACETAMINOPHEN 5-325 MG PO TABS: 2.0000 | ORAL_TABLET | ORAL | Status: DC | PRN

## 2016-03-22 MED ORDER — ONDANSETRON HCL 4 MG PO TABS: 4.0000 mg | ORAL_TABLET | ORAL | Status: DC | PRN

## 2016-03-22 MED ORDER — OXYCODONE HCL 5 MG PO TABS: 5.0000 mg | ORAL_TABLET | ORAL | Status: DC | PRN

## 2016-03-22 MED ORDER — SODIUM CHLORIDE 0.9 % IV SOLN
2.0000 g | Freq: Once | INTRAVENOUS | Status: AC
  Administered 2016-03-22: 2 g via INTRAVENOUS
  Filled 2016-03-22: qty 2000

## 2016-03-22 MED ORDER — ACETAMINOPHEN 500 MG PO TABS
1000.0000 mg | ORAL_TABLET | Freq: Four times a day (QID) | ORAL | Status: DC | PRN
  Administered 2016-03-22: 1000 mg via ORAL
  Filled 2016-03-22: qty 2

## 2016-03-22 MED ORDER — PHENYLEPHRINE 40 MCG/ML (10ML) SYRINGE FOR IV PUSH (FOR BLOOD PRESSURE SUPPORT): 80.0000 ug | PREFILLED_SYRINGE | INTRAVENOUS | Status: DC | PRN

## 2016-03-22 MED ORDER — BENZOCAINE-MENTHOL 20-0.5 % EX AERO
1.0000 "application " | INHALATION_SPRAY | CUTANEOUS | Status: DC | PRN
  Administered 2016-03-22: 1 via TOPICAL
  Filled 2016-03-22: qty 56

## 2016-03-22 MED ORDER — LIDOCAINE HCL (PF) 1 % IJ SOLN
30.0000 mL | INTRAMUSCULAR | Status: AC | PRN
Start: 2016-03-22 — End: 2016-03-24
  Administered 2016-03-22: 30 mL via SUBCUTANEOUS
  Filled 2016-03-22: qty 30

## 2016-03-22 MED ORDER — DEXTROSE 5 % IV SOLN
5.0000 10*6.[IU] | Freq: Once | INTRAVENOUS | Status: AC
  Administered 2016-03-22: 5 10*6.[IU] via INTRAVENOUS
  Filled 2016-03-22: qty 5

## 2016-03-22 MED ORDER — FENTANYL 2.5 MCG/ML BUPIVACAINE 1/10 % EPIDURAL INFUSION (WH - ANES)
14.0000 mL/h | INTRAMUSCULAR | Status: DC | PRN
  Administered 2016-03-22: 14 mL/h via EPIDURAL

## 2016-03-22 MED ORDER — DIBUCAINE 1 % RE OINT
1.0000 "application " | TOPICAL_OINTMENT | RECTAL | Status: DC | PRN
  Administered 2016-03-23: 1 via RECTAL
  Filled 2016-03-22: qty 28

## 2016-03-22 MED ORDER — PHENYLEPHRINE 40 MCG/ML (10ML) SYRINGE FOR IV PUSH (FOR BLOOD PRESSURE SUPPORT)
PREFILLED_SYRINGE | INTRAVENOUS | Status: AC
  Filled 2016-03-22: qty 20

## 2016-03-22 MED ORDER — EPHEDRINE 5 MG/ML INJ
10.0000 mg | INTRAVENOUS | Status: DC | PRN
Start: 2016-03-22 — End: 2016-03-22
  Filled 2016-03-22: qty 2

## 2016-03-22 MED ORDER — OXYTOCIN BOLUS FROM INFUSION: 500.0000 mL | INTRAVENOUS | Status: DC

## 2016-03-22 MED ORDER — DIPHENHYDRAMINE HCL 25 MG PO CAPS: 25.0000 mg | ORAL_CAPSULE | Freq: Four times a day (QID) | ORAL | Status: DC | PRN

## 2016-03-22 MED ORDER — SODIUM CHLORIDE 0.9 % IV SOLN
3.0000 g | Freq: Four times a day (QID) | INTRAVENOUS | Status: DC
  Administered 2016-03-22 – 2016-03-23 (×5): 3 g via INTRAVENOUS
  Filled 2016-03-22 (×7): qty 3

## 2016-03-22 MED ORDER — EPHEDRINE 5 MG/ML INJ: 10.0000 mg | INTRAVENOUS | Status: DC | PRN

## 2016-03-22 MED ORDER — PHENYLEPHRINE 40 MCG/ML (10ML) SYRINGE FOR IV PUSH (FOR BLOOD PRESSURE SUPPORT)
80.0000 ug | PREFILLED_SYRINGE | INTRAVENOUS | Status: DC | PRN
  Filled 2016-03-22: qty 5

## 2016-03-22 MED ORDER — NALBUPHINE HCL 10 MG/ML IJ SOLN
10.0000 mg | INTRAMUSCULAR | Status: DC | PRN
Start: 2016-03-22 — End: 2016-03-22
  Administered 2016-03-22: 10 mg via INTRAVENOUS
  Filled 2016-03-22: qty 1

## 2016-03-22 MED ORDER — PRENATAL MULTIVITAMIN CH
1.0000 | ORAL_TABLET | Freq: Every day | ORAL | Status: DC
  Administered 2016-03-23 – 2016-03-24 (×2): 1 via ORAL
  Filled 2016-03-22 (×2): qty 1

## 2016-03-22 MED ORDER — ONDANSETRON HCL 4 MG/2ML IJ SOLN: 4.0000 mg | INTRAMUSCULAR | Status: DC | PRN

## 2016-03-22 MED ORDER — WITCH HAZEL-GLYCERIN EX PADS: 1.0000 "application " | MEDICATED_PAD | CUTANEOUS | Status: DC | PRN

## 2016-03-22 MED ORDER — EPHEDRINE 5 MG/ML INJ
10.0000 mg | INTRAVENOUS | Status: DC | PRN
  Filled 2016-03-22: qty 2

## 2016-03-22 MED ORDER — PENICILLIN G POTASSIUM 5000000 UNITS IJ SOLR
2.5000 10*6.[IU] | Freq: Once | INTRAVENOUS | Status: DC
  Filled 2016-03-22: qty 2.5

## 2016-03-22 MED ORDER — LACTATED RINGERS IV SOLN
500.0000 mL | Freq: Once | INTRAVENOUS | Status: AC
  Administered 2016-03-22: 500 mL via INTRAVENOUS

## 2016-03-22 MED ORDER — TETANUS-DIPHTH-ACELL PERTUSSIS 5-2.5-18.5 LF-MCG/0.5 IM SUSP: 0.5000 mL | Freq: Once | INTRAMUSCULAR | Status: DC

## 2016-03-22 MED ORDER — OXYTOCIN 40 UNITS IN LACTATED RINGERS INFUSION - SIMPLE MED
2.5000 [IU]/h | INTRAVENOUS | Status: DC
  Administered 2016-03-22: 39.96 [IU]/h via INTRAVENOUS
  Filled 2016-03-22: qty 1000

## 2016-03-22 MED ORDER — FLEET ENEMA 7-19 GM/118ML RE ENEM: 1.0000 | ENEMA | RECTAL | Status: DC | PRN

## 2016-03-22 MED ORDER — SIMETHICONE 80 MG PO CHEW
80.0000 mg | CHEWABLE_TABLET | ORAL | Status: DC | PRN
Start: 2016-03-22 — End: 2016-03-24

## 2016-03-22 MED ORDER — ONDANSETRON HCL 4 MG/2ML IJ SOLN
4.0000 mg | Freq: Four times a day (QID) | INTRAMUSCULAR | Status: DC | PRN
  Administered 2016-03-22: 4 mg via INTRAVENOUS
  Filled 2016-03-22: qty 2

## 2016-03-22 MED ORDER — LIDOCAINE HCL (PF) 1 % IJ SOLN
INTRAMUSCULAR | Status: AC
  Administered 2016-03-22 (×2): 6 mL via EPIDURAL
  Filled 2016-03-22: qty 30

## 2016-03-22 MED ORDER — OXYCODONE HCL 5 MG PO TABS: 10.0000 mg | ORAL_TABLET | ORAL | Status: DC | PRN

## 2016-03-22 MED ORDER — LACTATED RINGERS IV SOLN
INTRAVENOUS | Status: DC
  Administered 2016-03-22: 01:00:00 via INTRAVENOUS

## 2016-03-22 MED ORDER — LACTATED RINGERS IV SOLN: 500.0000 mL | INTRAVENOUS | Status: DC | PRN

## 2016-03-22 NOTE — Progress Notes (Addendum)
Patient ID: Anna Oconnell, female   DOB: Mar 11, 1991, 25 y.o.   MRN: [id number] Pt comfortable with no complaints. +Fms VSS EFM- 145, +accels, - decels, mod variability, cat 1 TOCO-  Ctx every minute SVE - ant lip ( on pts left ant ), 100 eff, 0 station  A/P: G2P1001 at 38+ weeks - attempting VBAC         Tried push to see if lip is reducible; it was not and no descent noted as pt still very numb          Plan to labor down 30-5860mins  then start pushing again or prn         Anticipate successful VBAC

## 2016-03-22 NOTE — Progress Notes (Signed)
Utilization review completed.  L. J. Gaylen Pereira RN, BSN, CM 

## 2016-03-22 NOTE — H&P (Addendum)
Anna Oconnell is a 25 y.o. female presenting for painful regular contractions. Pt reported leakage of fluid last night but was noted to still be intact. After returning home her contractions started and became consistent and strong . When she returned to MAU she was noted to be 34cm dilated ( had been 3cm). Pt admitted for labor. Pt has a hx of primary c/s for CPD ( 8lbs 14oz). Pt desires VBAC. She has had a benign prenatal course. Her dating is based on  LMP and confirmed by a 10-11 week ultrasound.  Maternal Medical History:  Reason for admission: Contractions.  Nausea.  Contractions: Onset was 3-5 hours ago.   Frequency: regular.   Perceived severity is strong.    Fetal activity: Perceived fetal activity is normal.    Prenatal complications: no prenatal complications Prenatal Complications - Diabetes: none.    OB History    Gravida Para Term Preterm AB TAB SAB Ectopic Multiple Living   Past Medical History  Diagnosis Date  . Normal pregnancy, first 06/29/2014  . S/P cesarean section 07/01/2014   Past Surgical History  Procedure Laterality Date  . Cesarean section N/A 07/01/2014    Procedure: CESAREAN SECTION;  Surgeon: Sherian Rein, MD;  Location: [facility];  Service: Obstetrics;  Laterality: N/A;   Family History: family history is not on file. Social History:  reports that she has never smoked. She does not have any smokeless tobacco history on file. She reports that she does not drink alcohol. Her drug history is not on file.   Prenatal Transfer Tool  Maternal Diabetes: No Genetic Screening: Declined Maternal Ultrasounds/Referrals: Normal Fetal Ultrasounds or other Referrals:  None Maternal Substance Abuse:  No Significant Maternal Medications:  None Significant Maternal Lab Results:  Lab values include: Group B Strep positive Other Comments:  None  Review of Systems  Constitutional: Negative for fever, chills and malaise/fatigue.  Eyes:  Negative for blurred vision.  Respiratory: Negative for shortness of breath.   Cardiovascular: Negative for chest pain.  Gastrointestinal: Positive for abdominal pain. Negative for heartburn, nausea and vomiting.  Genitourinary: Negative for dysuria.  Musculoskeletal: Positive for back pain.  Skin: Negative for itching and rash.  Neurological: Negative for dizziness and headaches.  Psychiatric/Behavioral: Negative for depression, suicidal ideas and substance abuse. The patient is not nervous/anxious.     Dilation: 6 Effacement (%): 100 Station: -2 Exam by:: Banga Blood pressure 104/63, pulse 93, temperature 98.3 F (36.8 C), temperature source Oral, resp. rate 16, height  (1.575 m), weight 133 lb (60.328 kg), last menstrual period 06/24/2015, unknown if currently breastfeeding. Maternal Exam:  Uterine Assessment: Contraction strength is moderate.  Contraction frequency is regular.   Abdomen: Patient reports generalized tenderness.  Surgical scars: low transverse.   Estimated fetal weight is AGA.   Fetal presentation: vertex  Introitus: Normal vulva. Normal vagina.  Amniotic fluid character: clear.  Pelvis: adequate for delivery.   Cervix: Cervix evaluated by digital exam.     Physical Exam  Constitutional: She is oriented to person, place, and time. She appears well-developed.  Neck: Normal range of motion.  Respiratory: Effort normal.  GI: Soft. There is generalized tenderness.  Genitourinary: Vagina normal and uterus normal.  Musculoskeletal: [place] of motion. She exhibits no edema or tenderness.  Neurological: She is alert and oriented to person, place, and time.  Skin: Skin is warm.  Psychiatric: She has a normal mood and  affect. Her behavior is normal. Judgment and thought content normal.    Prenatal labs: ABO, Rh: --/--/O NEG (05/12 0120) Antibody: NEG (05/12 0120) Rubella: Immune (10/24 0000) RPR: Nonreactive (02/27 0000)  HBsAg: Negative (10/24 0000)   HIV: Non-reactive (10/24 0000)  GBS: Positive (04/20 0000)   Assessment/Plan: G2P1001 at 38 6/[redacted]wks gestation in active labor - attempting VBAC AROM performed with copious amount of clear fluid return Epidural with great pain relief Treat GBS with PCN Plan for VBAC  [name] 03/22/2016, 4:02 AM

## 2016-03-22 NOTE — Anesthesia Procedure Notes (Signed)
Epidural Patient location during procedure: OB Start time: 03/22/2016 2:51 AM End time: 03/22/2016 3:09 AM  Staffing Anesthesiologist: Jairo BenJACKSON, Casmira Cramer Performed by: anesthesiologist   Preanesthetic Checklist Completed: patient identified, surgical consent, pre-op evaluation, timeout performed, IV checked, risks and benefits discussed and monitors and equipment checked  Epidural Patient position: sitting Prep: ChloraPrep and site prepped and draped Patient monitoring: heart rate, continuous pulse ox and blood pressure Approach: midline Location: L3-L4 Injection technique: LOR air  Needle:  Needle type: Tuohy  Needle gauge: 17 G Needle length: 9 cm Needle insertion depth: 5.5 cm Catheter type: closed end flexible Catheter size: 19 Gauge Catheter at skin depth: 11 cm Test dose: negative (1% lidocaine)  Additional Notes Patient identified. Risks/Benefits/Options discussed with patient including but not limited to bleeding, infection, nerve damage, paralysis, failed block, incomplete pain control, headache, blood pressure changes, nausea, vomiting, reactions to medication both or allergic, itching and postpartum back pain. Confirmed with bedside nurse the patient's most recent platelet count. Confirmed with patient that they are not currently taking any anticoagulation, have any bleeding history or any family history of bleeding disorders. Patient expressed understanding and wished to proceed. All questions were answered.  Reason for block:procedure for pain

## 2016-03-22 NOTE — MAU Note (Signed)
CHARGE CANNOT TAKE  PT - NO BED

## 2016-03-22 NOTE — Anesthesia Preprocedure Evaluation (Addendum)
Anesthesia Evaluation  Patient identified by MRN, date of birth, ID band Patient awake    Reviewed: Allergy & Precautions, NPO status , Patient's Chart, lab work & pertinent test results  History of Anesthesia Complications Negative for: history of anesthetic complications  Airway Mallampati: I  TM Distance: >3 FB Neck ROM: Full    Dental  (+) Dental Advisory Given   Pulmonary neg pulmonary ROS,    breath sounds clear to auscultation       Cardiovascular negative cardio ROS   Rhythm:Regular Rate:Normal     Neuro/Psych negative neurological ROS     GI/Hepatic negative GI ROS, Neg liver ROS,   Endo/Other  negative endocrine ROS  Renal/GU negative Renal ROS     Musculoskeletal   Abdominal   Peds  Hematology plt 162   Anesthesia Other Findings   Reproductive/Obstetrics (+) Pregnancy                            Anesthesia Physical Anesthesia Plan  ASA: I  Anesthesia Plan: Epidural   Post-op Pain Management:    Induction:   Airway Management Planned: Natural Airway  Additional Equipment:   Intra-op Plan:   Post-operative Plan:   Informed Consent: I have reviewed the patients History and Physical, chart, labs and discussed the procedure including the risks, benefits and alternatives for the proposed anesthesia with the patient or authorized representative who has indicated his/her understanding and acceptance.   Dental advisory given  Plan Discussed with: CRNA and Surgeon  Anesthesia Plan Comments: (Patient identified. Risks/Benefits/Options discussed with patient including but not limited to bleeding, infection, nerve damage, paralysis, failed block, incomplete pain control, headache, blood pressure changes, nausea, vomiting, reactions to medication both or allergic, itching and postpartum back pain. Confirmed with bedside nurse the patient's most recent platelet count. Confirmed  with patient that they are not currently taking any anticoagulation, have any bleeding history or any family history of bleeding disorders. Patient expressed understanding and wished to proceed. All questions were answered.  )        Anesthesia Quick Evaluation

## 2016-03-22 NOTE — MAU Note (Signed)
PT WAS HERE EARLIER -   SENT  HOME-   3  CM   . INTACT

## 2016-03-22 NOTE — Lactation Note (Signed)
This note was copied from a baby's chart. Lactation Consultation Note  Patient Name: Anna Oconnell Today's Date: 03/22/2016 Reason for consult: Initial assessment Baby at 8 hr of life and mom reports bf is going well so far. She only latched her older child for 2 wk then went to pump to feed because of nipple pain. She is worried the nipple pain will happen again. She currently denies pain. Discussed baby behavior, feeding frequency, baby belly size, voids, wt loss, breast changes, and nipple care. Demonstrated manual expression, colostrum noted bilaterally, spoon in room. Given lactation handouts. Aware of OP services and support group.      Maternal Data Has patient been taught Hand Expression?: Yes Does the patient have breastfeeding experience prior to this delivery?: Yes  Feeding Feeding Type: Breast Fed Length of feed: 20 min  LATCH Score/Interventions Latch: Repeated attempts needed to sustain latch, nipple held in mouth throughout feeding, stimulation needed to elicit sucking reflex. Intervention(s): Adjust position;Assist with latch  Audible Swallowing: A few with stimulation Intervention(s): Skin to skin  Type of Nipple: Everted at rest and after stimulation  Comfort (Breast/Nipple): Soft / non-tender     Hold (Positioning): No assistance needed to correctly position infant at breast.  LATCH Score: 8  Lactation Tools Discussed/Used WIC Program: No   Consult Status Consult Status: Follow-up Date: 03/23/16 Follow-up type: In-patient    Anna Oconnell 03/22/2016, 6:30 PM

## 2016-03-22 NOTE — Anesthesia Postprocedure Evaluation (Signed)
Anesthesia Post Note  Patient: Anna Oconnell  Procedure(s) Performed: * No procedures listed *  Patient location during evaluation: Mother Baby Anesthesia Type: Epidural Level of consciousness: awake and alert and oriented Pain management: pain level controlled Vital Signs Assessment: post-procedure vital signs reviewed and stable Respiratory status: spontaneous breathing Postop Assessment: no headache, no backache, epidural receding, patient able to bend at knees, no signs of nausea or vomiting and adequate PO intake Anesthetic complications: no     Last Vitals:  Filed Vitals:   03/22/16 1235 03/22/16 1335  BP: 97/60 88/54  Pulse: 106 107  Temp: 37.4 C 37.1 C  Resp: 18 18    Last Pain:  Filed Vitals:   03/22/16 1348  PainSc: 0-No pain   Pain Goal: Patients Stated Pain Goal: 5 (03/22/16 0235)               [facility]SHALL,Elise Gladden

## 2016-03-23 LAB — CBC
HCT: 24.6 % — ABNORMAL LOW (ref 36.0–46.0)
Hemoglobin: 8.3 g/dL — ABNORMAL LOW (ref 12.0–15.0)
MCH: 27.6 pg (ref 26.0–34.0)
MCHC: 32.9 g/dL (ref 30.0–36.0)
MCV: 84 fL (ref 78.0–100.0)
Platelets: 128 K/uL — ABNORMAL LOW (ref 150–400)
RBC: 2.93 MIL/uL — ABNORMAL LOW (ref 3.87–5.11)
RDW: 14.5 % (ref 11.5–15.5)
WBC: 18.5 K/uL — ABNORMAL HIGH (ref 4.0–10.5)

## 2016-03-23 NOTE — Discharge Instructions (Signed)
Iron-Rich Diet ° °Iron is a mineral that helps your body to produce hemoglobin. Hemoglobin is a protein in your red blood cells that carries oxygen to your body's tissues. Eating too little iron may cause you to feel weak and tired, and it can increase your risk for infection. Eating enough iron is necessary for your body's metabolism, muscle function, and nervous system. °Iron is naturally found in many foods. It can also be added to foods or fortified in foods. There are two types of dietary iron: °· Heme iron. Heme iron is absorbed by the body more easily than nonheme iron. Heme iron is found in meat, poultry, and fish. °· Nonheme iron. Nonheme iron is found in dietary supplements, iron-fortified grains, beans, and vegetables. °You may need to follow an iron-rich diet if: °· You have been diagnosed with iron deficiency or iron-deficiency anemia. °· You have a condition that prevents you from absorbing dietary iron, such as: °¨ Infection in your intestines. °¨ Celiac disease. This involves long-lasting (chronic) inflammation of your intestines. °· You do not eat enough iron. °· You eat a diet that is high in foods that impair iron absorption. °· You have lost a lot of blood. °· You have heavy bleeding during your menstrual cycle. °· You are pregnant. °WHAT IS MY PLAN? °Your health care provider may help you to determine how much iron you need per day based on your condition. Generally, when a person consumes sufficient amounts of iron in the diet, the following iron needs are met: °· Men. °¨ 14-18 years old: 11 mg per day. °¨ 19-50 years old: 8 mg per day. °· Women.   °¨ 14-18 years old: 15 mg per day. °¨ 19-50 years old: 18 mg per day. °¨ Over 50 years old: 8 mg per day. °¨ Pregnant women: 27 mg per day. °¨ Breastfeeding women: 9 mg per day. °WHAT DO I NEED TO KNOW ABOUT AN IRON-RICH DIET? °· Eat fresh fruits and vegetables that are high in vitamin C along with foods that are high in iron. This will help increase  the amount of iron that your body absorbs from food, especially with foods containing nonheme iron. Foods that are high in vitamin C include oranges, peppers, tomatoes, and mango. °· Take iron supplements only as directed by your health care provider. Overdose of iron can be life-threatening. If you were prescribed iron supplements, take them with orange juice or a vitamin C supplement. °· Cook foods in pots and pans that are made from iron.   °· Eat nonheme iron-containing foods alongside foods that are high in heme iron. This helps to improve your iron absorption.   °· Certain foods and drinks contain compounds that impair iron absorption. Avoid eating these foods in the same meal as iron-rich foods or with iron supplements. These include: °¨ Coffee, black tea, and red wine. °¨ Milk, dairy products, and foods that are high in calcium. °¨ Beans, soybeans, and peas. °¨ Whole grains. °· When eating foods that contain both nonheme iron and compounds that impair iron absorption, follow these tips to absorb iron better.   °¨ Soak beans overnight before cooking. °¨ Soak whole grains overnight and drain them before using. °¨ Ferment flours before baking, such as using yeast in bread dough. °WHAT FOODS CAN I EAT? °Grains  °Iron-fortified breakfast cereal. Iron-fortified whole-wheat bread. Enriched rice. Sprouted grains. °Vegetables  °Spinach. Potatoes with skin. Green peas. Broccoli. Red and green bell peppers. Fermented vegetables. °Fruits  °Prunes. Raisins. Oranges. Strawberries. Mango. Grapefruit. °Meats and Other   Protein Sources  °Beef liver. Oysters. Beef. Shrimp. Turkey. Chicken. Tuna. Sardines. Chickpeas. Nuts. Tofu. °Beverages  °Tomato juice. Fresh orange juice. Prune juice. Hibiscus tea. Fortified instant breakfast shakes. °Condiments  °Tahini. Fermented soy sauce.  °Sweets and Desserts  °Black-strap molasses.  °Other  °Wheat germ. °The items listed above may not be a complete list of recommended foods or  beverages. Contact your dietitian for more options.  °WHAT FOODS ARE NOT RECOMMENDED? °Grains  °Whole grains. Bran cereal. Bran flour. Oats. °Vegetables  °Artichokes. Brussels sprouts. Kale. °Fruits  °Blueberries. Raspberries. Strawberries. Figs. °Meats and Other Protein Sources  °Soybeans. Products made from soy protein. °Dairy  °Milk. Cream. Cheese. Yogurt. Cottage cheese. °Beverages  °Coffee. Black tea. Red wine. °Sweets and Desserts  °Cocoa. Chocolate. Ice cream. °Other  °Basil. Oregano. Parsley. °The items listed above may not be a complete list of foods and beverages to avoid. Contact your dietitian for more information.  °  °This information is not intended to replace advice given to you by your health care provider. Make sure you discuss any questions you have with your health care provider. °  °Document Released: 06/11/2005 Document Revised: 11/18/2014 Document Reviewed: 05/25/2014 °Elsevier Interactive Patient Education ©2016 [employer] ° ° ° ° °

## 2016-03-23 NOTE — Lactation Note (Signed)
This note was copied from a baby's chart. Lactation Consultation Note  Patient Name: Anna Oconnell Today's Date: 03/23/2016 Reason for consult: Follow-up assessment Baby at 31 hr of life and mom is reporting "really bad" nipple pain. No skin break down was noted at this visit. She is using her milk on her nipple after feedings and the RN is going to bring her coconut oil. Baby has a tight lingual frenulum with an anterior insertion point. He has a high palate and does a lot of biting down on the gloved finger before he will suck. He will do short bursts of sucking then start biting down again. He can lift tongue to midline, extend tongue over gum ridge, and cup tongue. He has poor lateralization of tongue. Mom will continue to bf on demand 8+/24hr and f/u each bf with nipple care. Offered OP apt but she declined at this time. She is aware of support group.   Maternal Data    Feeding Feeding Type: Breast Fed Length of feed: 15 min  LATCH Score/Interventions Latch: Grasps breast easily, tongue down, lips flanged, rhythmical sucking.  Audible Swallowing: Spontaneous and intermittent  Type of Nipple: Everted at rest and after stimulation  Comfort (Breast/Nipple): Filling, red/small blisters or bruises, mild/mod discomfort  Problem noted: Mild/Moderate discomfort Interventions (Mild/moderate discomfort): Comfort gels  Hold (Positioning): No assistance needed to correctly position infant at breast.  LATCH Score: 9  Lactation Tools Discussed/Used     Consult Status Consult Status: Follow-up Date: 03/24/16 Follow-up type: In-patient    Anna Oconnell 03/23/2016, 5:33 PM

## 2016-03-23 NOTE — Progress Notes (Addendum)
Post Partum Day 1 Subjective: no complaints, up ad lib and tolerating PO  Normal lochia  Objective: Blood pressure 92/52, pulse 92, temperature 97.9 F (36.6 C), temperature source Oral, resp. rate 18, height 5\' 2"  (1.575 m), weight 133 lb (60.328 kg), last menstrual period 06/24/2015, SpO2 99 %, unknown if currently breastfeeding.  Physical Exam:  General: alert and cooperative Lochia: appropriate Uterine Fundus: firm    Recent Labs  03/22/16 0120 03/23/16 0535  HGB 12.2 8.3*  HCT 35.8* 24.6*    Assessment/Plan: Plan for discharge tomorrow   LOS: 1 day   Anna Oconnell 03/23/2016, 8:41 AM

## 2016-03-24 MED ORDER — IBUPROFEN 600 MG PO TABS
600.0000 mg | ORAL_TABLET | Freq: Four times a day (QID) | ORAL | Status: DC
Start: 2016-03-24 — End: 2020-08-08

## 2016-03-24 NOTE — Progress Notes (Signed)
Post Partum Day 2 Subjective: no complaints, voiding and tolerating PO  Objective: Blood pressure 100/59, pulse 78, temperature 98.1 F (36.7 C), temperature source Oral, resp. rate 18, height 5\' 2"  (1.575 m), weight 133 lb (60.328 kg), last menstrual period 06/24/2015, SpO2 100 %, unknown if currently breastfeeding.  Physical Exam:  General: alert and cooperative Lochia: appropriate Uterine Fundus: firm    Recent Labs  03/22/16 0120 03/23/16 0535  HGB 12.2 8.3*  HCT 35.8* 24.6*    Assessment/Plan: Discharge home   LOS: 2 days   Amaru Burroughs W 03/24/2016, 10:13 AM

## 2016-03-24 NOTE — Lactation Note (Addendum)
This note was copied from a baby's chart. Lactation Consultation Note  Follow up visit made prior to discharge.  Mom states nipples continue to feel sore.  She reports pain with initial latch which subsides as feeding progresses.  Both nipples red and intact.  Mom has been using expressed milk and coconut oil.  Comfort gels given because mom thought she saw a blister earlier.  Instructed on use and not to use with coconut oil but okay to rotate.  Breasts are filling.  Baby returned from circumcision crying and showing feeding cues.  Tight lingual frenulum noted while crying with possible restriction with elevation of tongue.  Observed mom latch baby using cradle hold.  Baby opens wide and latches easily.  Initial latch more shallow but deepened as baby suckled.  Bottom lip needed untucked.  Mild cheek dimpling noted and some clicking heard off and on.  Discussed with parents to talk to pediatrician about frenulum and call us if resources needed.  Reviewed discharge instructions and engorgement treatment.  Lactation services and support information reviewed and encouraged.  Patient Name: Anna GrenadaBrittany Glauser ZOXWR'UToday's Date: 03/24/2016 Reason for consult: Follow-up assessment;Breast/nipple pain   Maternal Data    Feeding Feeding Type: Breast Fed  LATCH Score/Interventions Latch: Grasps breast easily, tongue down, lips flanged, rhythmical sucking. Intervention(s): Adjust position;Breast massage;Breast compression  Audible Swallowing: Spontaneous and intermittent  Type of Nipple: Everted at rest and after stimulation  Comfort (Breast/Nipple): Filling, red/small blisters or bruises, mild/mod discomfort  Problem noted: Mild/Moderate discomfort Interventions (Mild/moderate discomfort): Comfort gels  Hold (Positioning): No assistance needed to correctly position infant at breast. Intervention(s): Breastfeeding basics reviewed;Support Pillows  LATCH Score: 9  Lactation Tools Discussed/Used      Consult Status Consult Status: Complete    Huston FoleyMOULDEN, Talley Kreiser S 03/24/2016, 10:37 AM

## 2016-03-24 NOTE — Discharge Summary (Signed)
OB Discharge Summary     Patient Name: Anna Oconnell Schneeberger DOB: 1991-08-25 MRN: [id number]  Date of admission: 03/22/2016 Delivering MD: Huel CoteICHARDSON, Smiley Birr   Date of discharge: 03/24/2016  Admitting diagnosis: 38 WEEKS CTX Intrauterine pregnancy: 9252w6d     Secondary diagnosis:  Active Problems:   Active labor   VBAC (vaginal birth after Cesarean)  Additional problems: Maternal fever in labor     Discharge diagnosis: Term Pregnancy Delivered and VBAC                                                                                                Post partum procedures:antibiotics postpartum  Augmentation: AROM and Pitocin  Complications: Intrauterine Inflammation or infection (Chorioamniotis)  Hospital course:  Onset of Labor With Vaginal Delivery     25 y.o. yo [id number] at 5152w6d was admitted in Active Labor on 03/22/2016. Patient had an uncomplicated labor course as follows:  Membrane Rupture Time/Date: 3:42 AM ,03/22/2016   Intrapartum Procedures: Episiotomy: None [1]                                         Lacerations:  2nd degree [3];Vaginal [6]  Patient had a delivery of a Viable infant. 03/22/2016  Information for the patient's newborn:  [place], Boy Anna Oconnell [[id number]][[id number]]        Pateint had an uncomplicated postpartum course.  She is ambulating, tolerating a regular diet, passing flatus, and urinating well. Patient is discharged home in stable condition on 03/24/2016.    Physical exam  Filed Vitals:   03/23/16 0033 03/23/16 0536 03/23/16 1800 03/24/16 0601  BP: 93/49 92/52 105/72 100/59  Pulse: 96 92 88 78  Temp: 97.8 F (36.6 C) 97.9 F (36.6 C) 97.9 F (36.6 C) 98.1 F (36.7 C)  TempSrc: Oral Oral Oral Oral  Resp: 18 18 18 18   Height:      Weight:      SpO2: 99%  100%    General: alert and cooperative Lochia: appropriate Uterine Fundus: firm DVT Evaluation: No evidence of DVT seen on physical exam.  Labs: Lab Results  Component Value Date   WBC 18.5*  03/23/2016   HGB 8.3* 03/23/2016   HCT 24.6* 03/23/2016   MCV 84.0 03/23/2016   PLT 128* 03/23/2016   No flowsheet data found.  Discharge instruction: per After Visit Summary and "Baby and Me Booklet".  After visit meds:    Medication List    TAKE these medications        ibuprofen 600 MG tablet  Commonly known as:  ADVIL,MOTRIN  Take 1 tablet (600 mg total) by mouth every 6 (six) hours.     prenatal multivitamin Tabs tablet  Take 1 tablet by mouth daily at 12 noon.        Diet: routine diet  Activity: Advance as tolerated. Pelvic rest for 6 weeks.   Outpatient follow up:6 weeks Follow up Appt:No future appointments. Follow up Visit:No Follow-up on file.  Postpartum contraception: Undecided  Newborn Data: Live  born female  Birth Weight: 8 lb 7.8 oz (3850 g) APGAR: 9, 9  Baby Feeding: Breast Disposition:home with mother   03/24/2016 Oliver Pila, MD

## 2016-03-29 ENCOUNTER — Encounter (HOSPITAL_COMMUNITY): Admission: RE | Payer: Self-pay | Source: Ambulatory Visit

## 2016-03-29 ENCOUNTER — Inpatient Hospital Stay (HOSPITAL_COMMUNITY): Admission: RE | Admit: 2016-03-29 | Payer: 59 | Source: Ambulatory Visit

## 2016-03-29 ENCOUNTER — Inpatient Hospital Stay (HOSPITAL_COMMUNITY): Admission: RE | Admit: 2016-03-29 | Payer: Self-pay | Source: Ambulatory Visit | Admitting: Obstetrics and Gynecology

## 2016-03-29 SURGERY — Surgical Case
Anesthesia: Regional

## 2019-12-21 ENCOUNTER — Other Ambulatory Visit: Payer: Self-pay | Admitting: Family Medicine

## 2020-02-20 ENCOUNTER — Other Ambulatory Visit: Payer: Self-pay | Admitting: Family Medicine

## 2020-05-04 ENCOUNTER — Encounter: Payer: Self-pay | Admitting: Legal Medicine

## 2020-05-04 ENCOUNTER — Other Ambulatory Visit: Payer: Self-pay

## 2020-05-04 ENCOUNTER — Ambulatory Visit: Payer: Managed Care, Other (non HMO) | Admitting: Legal Medicine

## 2020-05-04 VITALS — BP 118/74 | HR 81 | Temp 97.8°F | Ht 62.0 in | Wt 124.4 lb

## 2020-05-04 DIAGNOSIS — J340 Abscess, furuncle and carbuncle of nose: Secondary | ICD-10-CM | POA: Diagnosis not present

## 2020-05-04 MED ORDER — MUPIROCIN 2 % EX OINT: 1.0000 "application " | TOPICAL_OINTMENT | Freq: Two times a day (BID) | CUTANEOUS | 0 refills | Status: DC

## 2020-05-04 MED ORDER — SULFAMETHOXAZOLE-TRIMETHOPRIM 800-160 MG PO TABS: 1.0000 | ORAL_TABLET | Freq: Two times a day (BID) | ORAL | 0 refills | Status: DC

## 2020-05-04 NOTE — Progress Notes (Signed)
Acute Office Visit  Subjective:    Patient ID: Anna Oconnell, female    DOB: [birth year], 29 y.o.   MRN: [id number]  Chief Complaint  Patient presents with  . Cyst    HPI Patient is in today for intranasal abscess for one week, now more painful.    Past Medical History:  Diagnosis Date  . Normal pregnancy, first 06/29/2014  . S/P cesarean section 07/01/2014    Past Surgical History:  Procedure Laterality Date  . CESAREAN SECTION N/A 07/01/2014   Procedure: CESAREAN SECTION;  Surgeon: Anna Contes, MD;  Location: [facility];  Service: Obstetrics;  Laterality: N/A;    History reviewed. No pertinent family history.  Social History   Socioeconomic History  . Marital status: Married    Spouse name: Not on file  . Number of children: 2  . Years of education: Not on file  . Highest education level: Not on file  Occupational History    Employer: [employer]  Tobacco Use  . Smoking status: Never Smoker  . Smokeless tobacco: Never Used  Vaping Use  . Vaping Use: Never used  Substance and Sexual Activity  . Alcohol use: Yes  . Drug use: Not Currently  . Sexual activity: Yes  Other Topics Concern  . Not on file  Social History Narrative  . Not on file   Social Determinants of Health   Financial Resource Strain:   . Difficulty of Paying Living Expenses:   Food Insecurity:   . Worried About Charity fundraiser in the Last Year:   . Arboriculturist in the Last Year:   Transportation Needs:   . Film/video editor (Medical):   Marland Kitchen Lack of Transportation (Non-Medical):   Physical Activity:   . Days of Exercise per Week:   . Minutes of Exercise per Session:   Stress:   . Feeling of Stress :   Social Connections:   . Frequency of Communication with Friends and Family:   . Frequency of Social Gatherings with Friends and Family:   . Attends Religious Services:   . Active Member of Clubs or Organizations:   . Attends Archivist Meetings:   Marland Kitchen  Marital Status:   Intimate Partner Violence:   . Fear of Current or Ex-Partner:   . Emotionally Abused:   Marland Kitchen Physically Abused:   . Sexually Abused:     Outpatient Medications Prior to Visit  Medication Sig Dispense Refill  . ibuprofen (ADVIL,MOTRIN) 600 MG tablet Take 1 tablet (600 mg total) by mouth every 6 (six) hours. 30 tablet 0  . Prenatal Vit-Fe Fumarate-FA (PRENATAL MULTIVITAMIN) TABS tablet Take 1 tablet by mouth daily at 12 noon.    . sertraline (ZOLOFT) 100 MG tablet TAKE 1 1/2 TABLET BY ORAL ROUTE ONCE DAILY 105 tablet 1   No facility-administered medications prior to visit.    No Known Allergies  Review of Systems  Constitutional: Negative.   HENT: Negative.        Intranasal abscess.  Eyes: Negative.   Respiratory: Negative.   Cardiovascular: Negative.   Gastrointestinal: Negative.   Genitourinary: Negative.   Neurological: Negative.        Objective:    Physical Exam Vitals reviewed.  Constitutional:      Appearance: Normal appearance.  HENT:     Head: Normocephalic and atraumatic.     Right Ear: Tympanic membrane normal.     Left Ear: Tympanic membrane normal.     Nose:  Comments: Small abscess left naries, no drainage    Mouth/Throat:     Mouth: Mucous membranes are dry.  Eyes:     Extraocular Movements: Extraocular movements intact.     Conjunctiva/sclera: Conjunctivae normal.     Pupils: Pupils are equal, round, and reactive to light.  Cardiovascular:     Rate and Rhythm: Normal rate and regular rhythm.     Pulses: Normal pulses.     Heart sounds: Normal heart sounds.  Pulmonary:     Effort: Pulmonary effort is normal.     Breath sounds: Normal breath sounds.  Neurological:     Mental Status: She is alert.     BP 118/74   Pulse 81   Temp 97.8 F (36.6 C)   Ht _0  (1.575 m)   Wt 124 lb 6.4 oz (56.4 kg)   SpO2 97%   BMI 22.75 kg/m  Wt Readings from Last 3 Encounters:  05/04/20 124 lb 6.4 oz (56.4 kg)  03/22/16 133 lb (60.3  kg)  06/30/14 138 lb (62.6 kg)    Health Maintenance Due  Topic Date Due  . Hepatitis C Screening  Never done  . COVID-19 Vaccine (1) Never done  . PAP SMEAR-Modifier  Never done    There are no preventive care reminders to display for this patient.   No results found for: TSH Lab Results  Component Value Date   WBC 18.5 (H) 03/23/2016   HGB 8.3 (L) 03/23/2016   HCT 24.6 (L) 03/23/2016   MCV 84.0 03/23/2016   PLT 128 (L) 03/23/2016   No results found for: NA, K, CHLORIDE, CO2, GLUCOSE, BUN, CREATININE, BILITOT, ALKPHOS, AST, ALT, PROT, ALBUMIN, CALCIUM, ANIONGAP, EGFR, GFR No results found for: CHOL No results found for: HDL No results found for: LDLCALC No results found for: TRIG No results found for: CHOLHDL No results found for: HGBA1C     Assessment & Plan:  1. Nasal abscess - sulfamethoxazole-trimethoprim (BACTRIM DS) 800-160 MG tablet; Take 1 tablet by mouth 2 (two) times daily.  Dispense: 20 tablet; Refill: 0 - mupirocin ointment (BACTROBAN) 2 %; Place 1 application into the nose 2 (two) times daily.  Dispense: 22 g; Refill: 0 - WOUND CULTURE Patient has small intranasal abscess in left nares. Culture for MRSA, follow up if needed  Meds ordered this encounter  Medications  . sulfamethoxazole-trimethoprim (BACTRIM DS) 800-160 MG tablet    Sig: Take 1 tablet by mouth 2 (two) times daily.    Dispense:  20 tablet    Refill:  0  . mupirocin ointment (BACTROBAN) 2 %    Sig: Place 1 application into the nose 2 (two) times daily.    Dispense:  22 g    Refill:  0    Orders Placed This Encounter  Procedures  . WOUND CULTURE     Follow-up: Return if symptoms worsen or fail to improve.  An After Visit Summary was printed and given to the patient.  [place] [phone]

## 2020-05-08 ENCOUNTER — Other Ambulatory Visit: Payer: Self-pay | Admitting: Legal Medicine

## 2020-05-08 LAB — WOUND CULTURE: Organism ID, Bacteria: NONE SEEN

## 2020-05-08 NOTE — Progress Notes (Signed)
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

## 2020-05-19 LAB — HM PAP SMEAR: HM Pap smear: NEGATIVE

## 2020-08-08 ENCOUNTER — Encounter: Payer: Self-pay | Admitting: Family Medicine

## 2020-08-08 ENCOUNTER — Ambulatory Visit: Payer: Managed Care, Other (non HMO) | Admitting: Family Medicine

## 2020-08-08 ENCOUNTER — Other Ambulatory Visit: Payer: Self-pay

## 2020-08-08 VITALS — BP 122/72 | HR 75 | Temp 97.4°F | Ht 62.0 in | Wt 121.0 lb

## 2020-08-08 DIAGNOSIS — F411 Generalized anxiety disorder: Secondary | ICD-10-CM | POA: Diagnosis not present

## 2020-08-08 DIAGNOSIS — R5383 Other fatigue: Secondary | ICD-10-CM | POA: Diagnosis not present

## 2020-08-08 DIAGNOSIS — R202 Paresthesia of skin: Secondary | ICD-10-CM

## 2020-08-08 MED ORDER — VALACYCLOVIR HCL 1 G PO TABS: 2000.0000 mg | ORAL_TABLET | Freq: Two times a day (BID) | ORAL | 0 refills | Status: DC

## 2020-08-08 MED ORDER — SERTRALINE HCL 100 MG PO TABS: 100.0000 mg | ORAL_TABLET | Freq: Two times a day (BID) | ORAL | 0 refills | Status: DC

## 2020-08-08 NOTE — Patient Instructions (Addendum)
Generalized ancietyIncrease zoloft to 100 mg 2 daily.  Allergies: Recommend allegra, zyrtec, OR claritin daily.

## 2020-08-08 NOTE — Progress Notes (Signed)
Acute Office Visit  Subjective:    Patient ID: Anna Oconnell, female    DOB: 03-26-1991, 29 y.o.   MRN: [id number]  Chief Complaint  Patient presents with  . Headache  . Numbness    HPI Patient is in today for headaches and facial numbness that comes and goes, Not always together.. Sxs have been off and on for the past 2 months. Patient is unsure if tingling and numbness are associated with her headaches. States at times she can just move her head a certain way variable directions and she can feel the tingling start, Sunday she felt the tingling "run down" both her arms. Patient denies falling and hitting her head and no car accidents. No facial drooping. Symptoms last or a few seconds. Has neck pain x 1 week.  Occasional, but over the weekend was pretty persistent.  Headaches: Frontal. No nausea. No photophobia. Or phonophobia. Throbbing. Tylenol or ibuprofen. Headaches two times a week.  Pt is under a lot of stress. Sold home in March. Living in an apartment with 2 children. Gets along with husband. Works at [employer]. Denies depression. Averaged 8 hours of sleep, but does not feel rested. Headaches through out the day. Not just in am.   Past Medical History:  Diagnosis Date  . Normal pregnancy, first 06/29/2014  . S/P cesarean section 07/01/2014    Past Surgical History:  Procedure Laterality Date  . CESAREAN SECTION N/A 07/01/2014   Procedure: CESAREAN SECTION;  Surgeon: Janyth Contes, MD;  Location: [facility];  Service: Obstetrics;  Laterality: N/A;    History reviewed. No pertinent family history.  Social History   Socioeconomic History  . Marital status: Married    Spouse name: Not on file  . Number of children: 2  . Years of education: Not on file  . Highest education level: Not on file  Occupational History    Employer: [employer]  Tobacco Use  . Smoking status: Never Smoker  . Smokeless tobacco: Never Used  Vaping Use  . Vaping Use: Never used   Substance and Sexual Activity  . Alcohol use: Yes  . Drug use: Not Currently  . Sexual activity: Yes  Other Topics Concern  . Not on file  Social History Narrative  . Not on file   Social Determinants of Health   Financial Resource Strain:   . Difficulty of Paying Living Expenses: Not on file  Food Insecurity:   . Worried About Charity fundraiser in the Last Year: Not on file  . Ran Out of Food in the Last Year: Not on file  Transportation Needs:   . Lack of Transportation (Medical): Not on file  . Lack of Transportation (Non-Medical): Not on file  Physical Activity:   . Days of Exercise per Week: Not on file  . Minutes of Exercise per Session: Not on file  Stress:   . Feeling of Stress : Not on file  Social Connections:   . Frequency of Communication with Friends and Family: Not on file  . Frequency of Social Gatherings with Friends and Family: Not on file  . Attends Religious Services: Not on file  . Active Member of Clubs or Organizations: Not on file  . Attends Archivist Meetings: Not on file  . Marital Status: Not on file  Intimate Partner Violence:   . Fear of Current or Ex-Partner: Not on file  . Emotionally Abused: Not on file  . Physically Abused: Not on file  .  Sexually Abused: Not on file    Outpatient Medications Prior to Visit  Medication Sig Dispense Refill  . sulfamethoxazole-trimethoprim (BACTRIM DS) 800-160 MG tablet Take 1 tablet by mouth 2 (two) times daily. 20 tablet 0  . ibuprofen (ADVIL,MOTRIN) 600 MG tablet Take 1 tablet (600 mg total) by mouth every 6 (six) hours. 30 tablet 0  . mupirocin ointment (BACTROBAN) 2 % Place 1 application into the nose 2 (two) times daily. 22 g 0  . Prenatal Vit-Fe Fumarate-FA (PRENATAL MULTIVITAMIN) TABS tablet Take 1 tablet by mouth daily at 12 noon.    . sertraline (ZOLOFT) 100 MG tablet TAKE 1 1/2 TABLET BY ORAL ROUTE ONCE DAILY 105 tablet 1   No facility-administered medications prior to visit.     No Known Allergies  Review of Systems  Constitutional: Negative for chills and fatigue.  HENT: Negative for congestion, ear pain, rhinorrhea and sore throat.        3-4 cold sores lips.  Respiratory: Negative for cough and shortness of breath.   Cardiovascular: Negative for chest pain.  Neurological: Positive for dizziness (sometimes when standing up), numbness and headaches.       Objective:    Physical Exam Vitals reviewed.  Constitutional:      Appearance: Normal appearance. She is normal weight.  HENT:     Right Ear: Tympanic membrane, ear canal and external ear normal.     Left Ear: Tympanic membrane and external ear normal.     Nose: Congestion and rhinorrhea present.     Right Turbinates: Swollen and pale.     Left Turbinates: Swollen and pale.     Right Sinus: No maxillary sinus tenderness.     Left Sinus: No maxillary sinus tenderness.     Mouth/Throat:     Pharynx: Oropharynx is clear.  Neck:     Vascular: No carotid bruit.  Cardiovascular:     Rate and Rhythm: Normal rate and regular rhythm.     Pulses: Normal pulses.     Heart sounds: Normal heart sounds. No murmur heard.   Pulmonary:     Effort: Pulmonary effort is normal. No respiratory distress.     Breath sounds: Normal breath sounds.  Abdominal:     General: Abdomen is flat.     Palpations: Abdomen is soft.     Tenderness: There is no abdominal tenderness.  Neurological:     General: No focal deficit present.     Mental Status: She is alert and oriented to person, place, and time.     Cranial Nerves: No cranial nerve deficit.     Sensory: No sensory deficit.  Psychiatric:        Mood and Affect: Mood normal.        Behavior: Behavior normal.     BP 122/72   Pulse 75   Temp (!) 97.4 F (36.3 C)   Ht 5' 2"  (1.575 m)   Wt 121 lb (54.9 kg)   SpO2 99%   BMI 22.13 kg/m  Wt Readings from Last 3 Encounters:  08/08/20 121 lb (54.9 kg)  05/04/20 124 lb 6.4 oz (56.4 kg)  03/22/16 133 lb  (60.3 kg)    Health Maintenance Due  Topic Date Due  . Hepatitis C Screening  Never done  . COVID-19 Vaccine (1) Never done  . PAP SMEAR-Modifier  Never done  . INFLUENZA VACCINE  Never done    There are no preventive care reminders to display for this patient.   No  results found for: TSH Lab Results  Component Value Date   WBC 18.5 (H) 03/23/2016   HGB 8.3 (L) 03/23/2016   HCT 24.6 (L) 03/23/2016   MCV 84.0 03/23/2016   PLT 128 (L) 03/23/2016   No results found for: NA, K, CHLORIDE, CO2, GLUCOSE, BUN, CREATININE, BILITOT, ALKPHOS, AST, ALT, PROT, ALBUMIN, CALCIUM, ANIONGAP, EGFR, GFR No results found for: CHOL No results found for: HDL No results found for: LDLCALC No results found for: TRIG No results found for: CHOLHDL No results found for: HGBA1C     Assessment & Plan:  1. Other fatigue - TSH - CBC with Differential/Platelet - Comprehensive metabolic panel  2. GAD (generalized anxiety disorder) Increase zoloft 100 mg 2 daily. - sertraline (ZOLOFT) 100 MG tablet; Take 1 tablet (100 mg total) by mouth 2 (two) times daily.  Dispense: 60 tablet; Refill: 0  3. Paresthesia  monitor.  4. Allergic rhinitis. Recommend allegra, zyrtec, OR claritin daily.  Meds ordered this encounter  Medications  . sertraline (ZOLOFT) 100 MG tablet    Sig: Take 1 tablet (100 mg total) by mouth 2 (two) times daily.    Dispense:  60 tablet    Refill:  0  . valACYclovir (VALTREX) 1000 MG tablet    Sig: Take 2 tablets (2,000 mg total) by mouth 2 (two) times daily. Take at onset of cold sores x 1 day.    Dispense:  30 tablet    Refill:  0    Orders Placed This Encounter  Procedures  . TSH  . CBC with Differential/Platelet  . Comprehensive metabolic panel     Follow-up: Return in about 4 weeks (around 09/05/2020) for GAD.  An After Visit Summary was printed and given to the patient.  Rochel Brome [facility] [phone]

## 2020-08-09 LAB — COMPREHENSIVE METABOLIC PANEL
ALT: 14 IU/L (ref 0–32)
AST: 16 IU/L (ref 0–40)
Albumin/Globulin Ratio: 1.6 (ref 1.2–2.2)
Albumin: 4.6 g/dL (ref 3.8–4.8)
Alkaline Phosphatase: 100 IU/L (ref 44–121)
BUN/Creatinine Ratio: 16 (ref 9–23)
BUN: 11 mg/dL (ref 6–20)
Bilirubin Total: 0.4 mg/dL (ref 0.0–1.2)
CO2: 23 mmol/L (ref 20–29)
Calcium: 9.5 mg/dL (ref 8.7–10.2)
Chloride: 101 mmol/L (ref 96–106)
Creatinine, Ser: 0.67 mg/dL (ref 0.57–1.00)
GFR calc Af Amer: 135 mL/min/{1.73_m2} (ref >59)
GFR calc non Af Amer: 118 mL/min/{1.73_m2} (ref >59)
Globulin, Total: 2.9 g/dL (ref 1.5–4.5)
Glucose: 89 mg/dL (ref 65–99)
Potassium: 4.4 mmol/L (ref 3.5–5.2)
Sodium: 140 mmol/L (ref 134–144)
Total Protein: 7.5 g/dL (ref 6.0–8.5)

## 2020-08-09 LAB — TSH: TSH: 1.63 u[IU]/mL (ref 0.450–4.500)

## 2020-08-09 LAB — CBC WITH DIFFERENTIAL/PLATELET
Basophils Absolute: 0.1 10*3/uL (ref 0.0–0.2)
Basos: 1 %
EOS (ABSOLUTE): 0.2 10*3/uL (ref 0.0–0.4)
Eos: 3 %
Hematocrit: 40.2 % (ref 34.0–46.6)
Hemoglobin: 13.5 g/dL (ref 11.1–15.9)
Immature Grans (Abs): 0 10*3/uL (ref 0.0–0.1)
Immature Granulocytes: 0 %
Lymphocytes Absolute: 1.9 10*3/uL (ref 0.7–3.1)
Lymphs: 21 %
MCH: 27.9 pg (ref 26.6–33.0)
MCHC: 33.6 g/dL (ref 31.5–35.7)
MCV: 83 fL (ref 79–97)
Monocytes Absolute: 0.6 10*3/uL (ref 0.1–0.9)
Monocytes: 6 %
Neutrophils Absolute: 6.1 10*3/uL (ref 1.4–7.0)
Neutrophils: 69 %
Platelets: 213 10*3/uL (ref 150–450)
RBC: 4.84 x10E6/uL (ref 3.77–5.28)
RDW: 13.1 % (ref 11.7–15.4)
WBC: 8.8 10*3/uL (ref 3.4–10.8)

## 2020-08-20 ENCOUNTER — Other Ambulatory Visit: Payer: Self-pay | Admitting: Family Medicine

## 2020-08-20 DIAGNOSIS — F411 Generalized anxiety disorder: Secondary | ICD-10-CM

## 2020-09-01 ENCOUNTER — Other Ambulatory Visit: Payer: Self-pay | Admitting: Family Medicine

## 2020-09-01 DIAGNOSIS — F411 Generalized anxiety disorder: Secondary | ICD-10-CM

## 2020-09-07 NOTE — Progress Notes (Signed)
Canceled.

## 2020-09-08 ENCOUNTER — Ambulatory Visit (INDEPENDENT_AMBULATORY_CARE_PROVIDER_SITE_OTHER): Payer: Managed Care, Other (non HMO) | Admitting: Family Medicine

## 2020-09-08 DIAGNOSIS — F411 Generalized anxiety disorder: Secondary | ICD-10-CM

## 2020-11-24 ENCOUNTER — Ambulatory Visit: Payer: Managed Care, Other (non HMO) | Admitting: Family Medicine

## 2020-11-24 ENCOUNTER — Encounter: Payer: Self-pay | Admitting: Family Medicine

## 2020-11-24 ENCOUNTER — Other Ambulatory Visit: Payer: Self-pay

## 2020-11-24 VITALS — BP 90/52 | HR 72 | Temp 98.0°F | Resp 14 | Ht 62.0 in | Wt 119.0 lb

## 2020-11-24 DIAGNOSIS — N3001 Acute cystitis with hematuria: Secondary | ICD-10-CM | POA: Diagnosis not present

## 2020-11-24 LAB — POCT URINALYSIS DIPSTICK
Leukocytes, UA: NEGATIVE
Nitrite, UA: NEGATIVE

## 2020-11-24 MED ORDER — NITROFURANTOIN MONOHYD MACRO 100 MG PO CAPS: 100.0000 mg | ORAL_CAPSULE | Freq: Two times a day (BID) | ORAL | 0 refills | Status: DC

## 2020-11-24 MED ORDER — PHENAZOPYRIDINE HCL 200 MG PO TABS: 200.0000 mg | ORAL_TABLET | Freq: Three times a day (TID) | ORAL | 0 refills | Status: DC | PRN

## 2020-11-24 NOTE — Progress Notes (Signed)
Acute Office Visit  Subjective:    Patient ID: Anna Oconnell, female    DOB: [birth year], 30 y.o.   MRN: [id number]  Chief Complaint  Patient presents with  . Urinary Tract Infection    HPI Patient is in today for Dysuria x 3 days. No fevers, chills, abdominal pain, flank pain.  Past Medical History:  Diagnosis Date  . Normal pregnancy, first 06/29/2014  . S/P cesarean section 07/01/2014    Past Surgical History:  Procedure Laterality Date  . CESAREAN SECTION N/A 07/01/2014   Procedure: CESAREAN SECTION;  Surgeon: Sherian Rein, MD;  Location: [facility];  Service: Obstetrics;  Laterality: N/A;    No family history on file.  Social History   Socioeconomic History  . Marital status: Married    Spouse name: Not on file  . Number of children: 2  . Years of education: Not on file  . Highest education level: Not on file  Occupational History    Employer: [employer]  Tobacco Use  . Smoking status: Never Smoker  . Smokeless tobacco: Never Used  Vaping Use  . Vaping Use: Never used  Substance and Sexual Activity  . Alcohol use: Yes  . Drug use: Not Currently  . Sexual activity: Yes  Other Topics Concern  . Not on file  Social History Narrative  . Not on file   Social Determinants of Health   Financial Resource Strain: Not on file  Food Insecurity: Not on file  Transportation Needs: Not on file  Physical Activity: Not on file  Stress: Not on file  Social Connections: Not on file  Intimate Partner Violence: Not on file    Outpatient Medications Prior to Visit  Medication Sig Dispense Refill  . sertraline (ZOLOFT) 100 MG tablet TAKE 1 TABLET BY MOUTH TWICE A DAY 60 tablet 1  . valACYclovir (VALTREX) 1000 MG tablet Take 2 tablets (2,000 mg total) by mouth 2 (two) times daily. Take at onset of cold sores x 1 day. 30 tablet 0   No facility-administered medications prior to visit.    No Known Allergies  Review of Systems  Constitutional: Negative  for chills and fever.  HENT: Negative for congestion.   Respiratory: Negative for cough and shortness of breath.   Cardiovascular: Negative for chest pain.  Gastrointestinal: Negative for abdominal pain and nausea.  Genitourinary: Positive for dysuria, frequency and urgency.  Musculoskeletal: Negative for back pain.  Psychiatric/Behavioral: Negative for dysphoric mood. The patient is not nervous/anxious.        Objective:    Physical Exam Vitals reviewed.  Constitutional:      Appearance: Normal appearance.  Cardiovascular:     Rate and Rhythm: Normal rate and regular rhythm.     Heart sounds: Normal heart sounds.  Pulmonary:     Effort: Pulmonary effort is normal.     Breath sounds: Normal breath sounds.  Abdominal:     Palpations: Abdomen is soft.     Tenderness: There is no abdominal tenderness. There is no right CVA tenderness or left CVA tenderness.  Neurological:     Mental Status: She is alert.     BP (!) 90/52   Pulse 72   Temp 98 F (36.7 C)   Resp 14   Ht 5\' 2"  (1.575 m)   Wt 119 lb (54 kg)   BMI 21.77 kg/m  Wt Readings from Last 3 Encounters:  11/24/20 119 lb (54 kg)  08/08/20 121 lb (54.9 kg)  05/04/20 124 lb  6.4 oz (56.4 kg)    Health Maintenance Due  Topic Date Due  . Hepatitis C Screening  Never done  . COVID-19 Vaccine (1) Never done  . PAP SMEAR-Modifier  Never done  . INFLUENZA VACCINE  Never done    There are no preventive care reminders to display for this patient.   Lab Results  Component Value Date   TSH 1.630 08/08/2020   Lab Results  Component Value Date   WBC 8.8 08/08/2020   HGB 13.5 08/08/2020   HCT 40.2 08/08/2020   MCV 83 08/08/2020   PLT 213 08/08/2020   Lab Results  Component Value Date   NA 140 08/08/2020   K 4.4 08/08/2020   CO2 23 08/08/2020   GLUCOSE 89 08/08/2020   BUN 11 08/08/2020   CREATININE 0.67 08/08/2020   BILITOT 0.4 08/08/2020   ALKPHOS 100 08/08/2020   AST 16 08/08/2020   ALT 14 08/08/2020    PROT 7.5 08/08/2020   ALBUMIN 4.6 08/08/2020   CALCIUM 9.5 08/08/2020   No results found for: CHOL No results found for: HDL No results found for: LDLCALC No results found for: TRIG No results found for: CHOLHDL No results found for: SHFW2O     Assessment & Plan:  1. Acute cystitis with hematuria - Urine Culture - POCT urinalysis dipstick - nitrofurantoin, macrocrystal-monohydrate, (MACROBID) 100 MG capsule; Take 1 capsule (100 mg total) by mouth 2 (two) times daily.  Dispense: 10 capsule; Refill: 0 - phenazopyridine (PYRIDIUM) 200 MG tablet; Take 1 tablet (200 mg total) by mouth 3 (three) times daily as needed for pain.  Dispense: 10 tablet; Refill: 0    Meds ordered this encounter  Medications  . nitrofurantoin, macrocrystal-monohydrate, (MACROBID) 100 MG capsule    Sig: Take 1 capsule (100 mg total) by mouth 2 (two) times daily.    Dispense:  10 capsule    Refill:  0  . phenazopyridine (PYRIDIUM) 200 MG tablet    Sig: Take 1 tablet (200 mg total) by mouth 3 (three) times daily as needed for pain.    Dispense:  10 tablet    Refill:  0    Orders Placed This Encounter  Procedures  . Urine Culture  . POCT urinalysis dipstick     Follow-up: Return if symptoms worsen or fail to improve.  An After Visit Summary was printed and given to the patient.  Blane Ohara, MD [facility] [phone]

## 2020-11-28 LAB — URINE CULTURE

## 2020-12-01 ENCOUNTER — Ambulatory Visit: Payer: 59 | Admitting: Family Medicine

## 2021-02-15 ENCOUNTER — Other Ambulatory Visit: Payer: Self-pay | Admitting: Family Medicine

## 2021-02-15 DIAGNOSIS — F411 Generalized anxiety disorder: Secondary | ICD-10-CM

## 2021-05-22 ENCOUNTER — Ambulatory Visit: Payer: Managed Care, Other (non HMO) | Admitting: Family Medicine

## 2021-05-22 ENCOUNTER — Encounter: Payer: Self-pay | Admitting: Family Medicine

## 2021-05-22 ENCOUNTER — Other Ambulatory Visit: Payer: Self-pay

## 2021-05-22 VITALS — BP 98/62 | HR 78 | Temp 97.4°F | Ht 62.0 in | Wt 111.0 lb

## 2021-05-22 DIAGNOSIS — F331 Major depressive disorder, recurrent, moderate: Secondary | ICD-10-CM | POA: Diagnosis not present

## 2021-05-22 DIAGNOSIS — R5383 Other fatigue: Secondary | ICD-10-CM

## 2021-05-22 DIAGNOSIS — F411 Generalized anxiety disorder: Secondary | ICD-10-CM

## 2021-05-22 NOTE — Patient Instructions (Signed)
Start trintellix 10 mg once daily today Decrease zoloft to 100 mg once daily for 1 week. Discontinue after one week.   Managing Anxiety, Adult After being diagnosed with an anxiety disorder, you may be relieved to know why you have felt or behaved a certain way. You may also feel overwhelmed about the treatment ahead and what it will mean for your life. With care and support, youcan manage this condition and recover from it. How to manage lifestyle changes Managing stress and anxiety  Stress is your body's reaction to life changes and events, both good and bad. Most stress will last just a few hours, but stress can be ongoing and can lead to more than just stress. Although stress can play a major role in anxiety, it is not the same as anxiety. Stress is usually caused by something external, such as a deadline, test, or competition. Stress normally passes after thetriggering event has ended.  Anxiety is caused by something internal, such as imagining a terrible outcome or worrying that something will go wrong that will devastate you. Anxiety often does not go away even after the triggering event is over, and it can become long-term (chronic) worry. It is important to understand the differences between stress and anxiety and to manage your stress effectively so that it does not lead to ananxious response. Talk with your health care provider or a counselor to learn more about reducing anxiety and stress. He or she may suggest tension reduction techniques, such as: Music therapy. This can include creating or listening to music that you enjoy and that inspires you. Mindfulness-based meditation. This involves being aware of your normal breaths while not trying to control your breathing. It can be done while sitting or walking. Centering prayer. This involves focusing on a word, phrase, or sacred image that means something to you and brings you peace. Deep breathing. To do this, expand your stomach and inhale  slowly through your nose. Hold your breath for 3-5 seconds. Then exhale slowly, letting your stomach muscles relax. Self-talk. This involves identifying thought patterns that lead to anxiety reactions and changing those patterns. Muscle relaxation. This involves tensing muscles and then relaxing them. Choose a tension reduction technique that suits your lifestyle and personality. These techniques take time and practice. Set aside 5-15 minutes a day to do them. Therapists can offer counseling and training in these techniques. The training to help with anxiety may be covered by some insurance plans. Other things you can do to manage stress and anxiety include: Keeping a stress/anxiety diary. This can help you learn what triggers your reaction and then learn ways to manage your response. Thinking about how you react to certain situations. You may not be able to control everything, but you can control your response. Making time for activities that help you relax and not feeling guilty about spending your time in this way. Visual imagery and yoga can help you stay calm and relax.  Medicines Medicines can help ease symptoms. Medicines for anxiety include: Anti-anxiety drugs. Antidepressants. Medicines are often used as a primary treatment for anxiety disorder. Medicines will be prescribed by a health care provider. When used together, medicines, psychotherapy, and tension reduction techniques may be the most effectivetreatment. Relationships Relationships can play a big part in helping you recover. Try to spend more time connecting with trusted friends and family members. Consider going to couples counseling, taking family education classes, or going to familytherapy. Therapy can help you and others better understand your condition.  How to recognize changes in your anxiety Everyone responds differently to treatment for anxiety. Recovery from anxiety happens when symptoms decrease and stop interfering with  your daily activities at home or work. This may mean that you will start to: Have better concentration and focus. Worry will interfere less in your daily thinking. Sleep better. Be less irritable. Have more energy. Have improved memory. It is important to recognize when your condition is getting worse. Contact your health care provider if your symptoms interfere with home or work and you feellike your condition is not improving. Follow these instructions at home: Activity Exercise. Most adults should do the following: Exercise for at least 150 minutes each week. The exercise should increase your heart rate and make you sweat (moderate-intensity exercise). Strengthening exercises at least twice a week. Get the right amount and quality of sleep. Most adults need 7-9 hours of sleep each night. Lifestyle  Eat a healthy diet that includes plenty of vegetables, fruits, whole grains, low-fat dairy products, and lean protein. Do not eat a lot of foods that are high in solid fats, added sugars, or salt. Make choices that simplify your life. Do not use any products that contain nicotine or tobacco, such as cigarettes, e-cigarettes, and chewing tobacco. If you need help quitting, ask your health care provider. Avoid caffeine, alcohol, and certain over-the-counter cold medicines. These may make you feel worse. Ask your pharmacist which medicines to avoid.  General instructions Take over-the-counter and prescription medicines only as told by your health care provider. Keep all follow-up visits as told by your health care provider. This is important. Where to find support You can get help and support from these sources: Self-help groups. Online and [employer]. A trusted spiritual leader. Couples counseling. Family education classes. Family therapy. Where to find more information You may find that joining a support group helps you deal with your anxiety. The following sources can help  you locate counselors or support groups near you: Mental Health America: www.mentalhealthamerica.net Anxiety and Depression Association of [place] (ADAA): ProgramCam.de The [employer] on Mental Illness (NAMI): www.nami.org Contact a health care provider if you: Have a hard time staying focused or finishing daily tasks. Spend many hours a day feeling worried about everyday life. Become exhausted by worry. Start to have headaches, feel tense, or have nausea. Urinate more than normal. Have diarrhea. Get help right away if you have: A racing heart and shortness of breath. Thoughts of hurting yourself or others. If you ever feel like you may hurt yourself or others, or have thoughts about taking your own life, get help right away. You can go to your nearest emergency department or call: Your local emergency services (911 in the [place] A suicide crisis helpline, such as the National Suicide Prevention Lifeline at (819)855-4575. This is open 24 hours a day. Summary Taking steps to learn and use tension reduction techniques can help calm you and help prevent triggering an anxiety reaction. When used together, medicines, psychotherapy, and tension reduction techniques may be the most effective treatment. Family, friends, and partners can play a big part in helping you recover from an anxiety disorder. This information is not intended to replace advice given to you by your health care provider. Make sure you discuss any questions you have with your healthcare provider. Document Revised: 03/30/2019 Document Reviewed: 03/30/2019 Elsevier Patient Education  2022 Elsevier Inc.  Managing Depression, Adult Depression is a mental health condition that affects your thoughts, feelings, and actions. Being diagnosed with depression  can bring you relief if you did not know why you have felt or behaved a certain way. It could also leave you feeling overwhelmed with uncertainty about your future. Preparing  yourself tomanage your symptoms can help you feel more positive about your future. How to manage lifestyle changes Managing stress  Stress is your body's reaction to life changes and events, both good and bad. Stress can add to your feelings of depression. Learning to manage your stresscan help lessen your feelings of depression. Try some of the following approaches to reducing your stress (stress reduction techniques): Listen to music that you enjoy and that inspires you. Try using a meditation app or take a meditation class. Develop a practice that helps you connect with your spiritual self. Walk in nature, pray, or go to a place of worship. Do some deep breathing. To do this, inhale slowly through your nose. Pause at the top of your inhale for a few seconds and then exhale slowly, letting your muscles relax. Practice yoga to help relax and work your muscles. Choose a stress reduction technique that suits your lifestyle and personality. These techniques take time and practice to develop. Set aside 5-15 minutes a day to do them. Therapists can offer training in these techniques. Other things you can do to manage stress include: Keeping a stress diary. Knowing your limits and saying no when you think something is too much. Paying attention to how you react to certain situations. You may not be able to control everything, but you can change your reaction. Adding humor to your life by watching funny films or TV shows. Making time for activities that you enjoy and that relax you.  Medicines Medicines, such as antidepressants, are often a part of treatment for depression. Talk with your pharmacist or health care provider about all the medicines, supplements, and herbal products that you take, their possible side effects, and what medicines and other products are safe to take together. Make sure to report any side effects you may have to your health care provider. Relationships Your health care  provider may suggest family therapy, couples therapy, orindividual therapy as part of your treatment. How to recognize changes Everyone responds differently to treatment for depression. As you recover from depression, you may start to: Have more interest in doing activities. Feel less hopeless. Have more energy. Overeat less often, or have a better appetite. Have better mental focus. It is important to recognize if your depression is not getting better or is getting worse. The symptoms you had in the beginning may return, such as: Tiredness (fatigue) or low energy. Eating too much or too little. Sleeping too much or too little. Feeling restless, agitated, or hopeless. Trouble focusing or making decisions. Unexplained physical complaints. Feeling irritable, angry, or aggressive. If you or your family members notice these symptoms coming back, let yourhealth care provider know right away. Follow these instructions at home: Activity  Try to get some form of exercise each day, such as walking, biking, swimming, or lifting weights. Practice stress reduction techniques. Engage your mind by taking a class or doing some volunteer work.  Lifestyle Get the right amount and quality of sleep. Cut down on using caffeine, tobacco, alcohol, and other potentially harmful substances. Eat a healthy diet that includes plenty of vegetables, fruits, whole grains, low-fat dairy products, and lean protein. Do not eat a lot of foods that are high in solid fats, added sugars, or salt (sodium). General instructions Take over-the-counter and prescription medicines only  as told by your health care provider. Keep all follow-up visits as told by your health care provider. This is important. Where to find support Talking to others  Friends and family members can be sources of support and guidance. Talk to trusted friends or family members about your condition. Explain your symptoms to them, and let them know that  you are working with a health care provider to treat your depression. Tell friends and family members how they also can behelpful. Finances Find appropriate mental health providers that fit with your financial situation. Talk with your health care provider about options to get reduced prices on your medicines. Where to find more information You can find support in your area from: Anxiety and Depression Association of America (ADAA): www.adaa.org Mental Health America: www.mentalhealthamerica.net The First Americanational Alliance on Mental Illness: www.nami.org Contact a health care provider if: You stop taking your antidepressant medicines, and you have any of these symptoms: Nausea. Headache. Light-headedness. Chills and body aches. Not being able to sleep (insomnia). You or your friends and family think your depression is getting worse. Get help right away if: You have thoughts of hurting yourself or others. If you ever feel like you may hurt yourself or others, or have thoughts about taking your own life, get help right away. Go to your nearest emergency department or: Call your local emergency services (911 in the [place] Call a suicide crisis helpline, such as the National Suicide Prevention Lifeline at 727-136-34511-843-084-5958. This is open 24 hours a day in the [place] Text the Crisis Text Line at 206 786 5770741741 (in the [place] Summary If you are diagnosed with depression, preparing yourself to manage your symptoms is a good way to feel positive about your future. Work with your health care provider on a management plan that includes stress reduction techniques, medicines (if applicable), therapy, and healthy lifestyle habits. Keep talking with your health care provider about how your treatment is working. If you have thoughts about taking your own life, call a suicide crisis helpline or text a crisis text line. This information is not intended to replace advice given to you by your health care provider. Make sure you  discuss any questions you have with your healthcare provider. Document Revised: 09/08/2019 Document Reviewed: 09/08/2019 Elsevier Patient Education  2022 [employer]

## 2021-05-22 NOTE — Progress Notes (Signed)
Subjective:  Patient ID: Anna Oconnell, female    DOB: 1991/01/31  Age: 30 y.o. MRN: [id number]  Chief Complaint  Patient presents with   Anxiety    HPI Anxiety-sertraline 100 mg BID patients feels current treatment is not as effective as it was in the past. For the last 6 months, pt has had increased fatigue, hypersomonlence, anhedonia, depressed, racing thoughts, and trouble concentrating. Poor sex drive. Married and 2 children. Works at an [employer]. Lots of stress.   Current Outpatient Medications on File Prior to Visit  Medication Sig Dispense Refill   sertraline (ZOLOFT) 100 MG tablet TAKE 2 TABLETS BY MOUTH EVERY DAY 180 tablet 2   valACYclovir (VALTREX) 1000 MG tablet Take 2 tablets (2,000 mg total) by mouth 2 (two) times daily. Take at onset of cold sores x 1 day. 30 tablet 0   No current facility-administered medications on file prior to visit.   Past Medical History:  Diagnosis Date   Normal pregnancy, first 06/29/2014   S/P cesarean section 07/01/2014   Past Surgical History:  Procedure Laterality Date   CESAREAN SECTION N/A 07/01/2014   Procedure: CESAREAN SECTION;  Surgeon: Sherian Rein, MD;  Location: [facility];  Service: Obstetrics;  Laterality: N/A;    No family history on file. Social History   Socioeconomic History   Marital status: Married    Spouse name: Not on file   Number of children: 2   Years of education: Not on file   Highest education level: Not on file  Occupational History    Employer: [employer]  Tobacco Use   Smoking status: Never   Smokeless tobacco: Never  Vaping Use   Vaping Use: Never used  Substance and Sexual Activity   Alcohol use: Yes   Drug use: Not Currently   Sexual activity: Yes  Other Topics Concern   Not on file  Social History Narrative   Not on file   Social Determinants of Health   Financial Resource Strain: Not on file  Food Insecurity: Not on file  Transportation Needs: Not on file   Physical Activity: Not on file  Stress: Not on file  Social Connections: Not on file   Review of Systems  Constitutional:  Negative for chills, fatigue and fever.  HENT:  Negative for congestion, ear pain, rhinorrhea and sore throat.   Respiratory:  Negative for cough and shortness of breath.   Cardiovascular:  Negative for chest pain.  Neurological:  Negative for dizziness and headaches.  Psychiatric/Behavioral:  Negative for dysphoric mood. The patient is nervous/anxious.    Objective:  BP 98/62   Pulse 78   Temp (!) 97.4 F (36.3 C)   Ht 5\' 2"  (1.575 m)   Wt 111 lb (50.3 kg)   SpO2 100%   BMI 20.30 kg/m   BP/Weight 05/22/2021 11/24/2020 08/08/2020  Systolic BP 98 90 122  Diastolic BP 62 52 72  Wt. (Lbs) 111 119 121  BMI 20.3 21.77 22.13    Physical Exam Vitals reviewed.  Constitutional:      Appearance: Normal appearance. She is normal weight.  Cardiovascular:     Rate and Rhythm: Normal rate and regular rhythm.     Heart sounds: Normal heart sounds.  Pulmonary:     Effort: Pulmonary effort is normal. No respiratory distress.     Breath sounds: Normal breath sounds.  Neurological:     Mental Status: She is alert and oriented to person, place, and time.  Psychiatric:  Behavior: Behavior normal.     Comments: depressed    Diabetic Foot Exam - Simple   No data filed      Lab Results  Component Value Date   WBC 8.8 08/08/2020   HGB 13.5 08/08/2020   HCT 40.2 08/08/2020   PLT 213 08/08/2020   GLUCOSE 89 08/08/2020   ALT 14 08/08/2020   AST 16 08/08/2020   NA 140 08/08/2020   K 4.4 08/08/2020   CL 101 08/08/2020   CREATININE 0.67 08/08/2020   BUN 11 08/08/2020   CO2 23 08/08/2020   TSH 1.630 08/08/2020      Assessment & Plan:   1. Other fatigue - CBC with Differential/Platelet - Comprehensive metabolic panel - TSH  2. GAD (generalized anxiety disorder) Start trintellix 10 mg once daily today Decrease zoloft to 100 mg once daily for 1  week. Discontinue after one week.   3. Moderate recurrent major depression (HCC)  Start trintellix 10 mg once daily today Decrease zoloft to 100 mg once daily for 1 week. Discontinue after one week.   Orders Placed This Encounter  Procedures   CBC with Differential/Platelet   Comprehensive metabolic panel   TSH   Follow-up: Return in about 4 weeks (around 06/19/2021) for depression.  An After Visit Summary was printed and given to the patient.  Blane Ohara, MD [facility] [phone]

## 2021-05-23 LAB — COMPREHENSIVE METABOLIC PANEL
ALT: 9 IU/L (ref 0–32)
AST: 13 IU/L (ref 0–40)
Albumin/Globulin Ratio: 1.8 (ref 1.2–2.2)
Albumin: 4.6 g/dL (ref 3.8–4.8)
Alkaline Phosphatase: 84 IU/L (ref 44–121)
BUN/Creatinine Ratio: 18 (ref 9–23)
BUN: 12 mg/dL (ref 6–20)
Bilirubin Total: 0.3 mg/dL (ref 0.0–1.2)
CO2: 23 mmol/L (ref 20–29)
Calcium: 9.3 mg/dL (ref 8.7–10.2)
Chloride: 99 mmol/L (ref 96–106)
Creatinine, Ser: 0.67 mg/dL (ref 0.57–1.00)
Globulin, Total: 2.5 g/dL (ref 1.5–4.5)
Glucose: 71 mg/dL (ref 65–99)
Potassium: 3.7 mmol/L (ref 3.5–5.2)
Sodium: 140 mmol/L (ref 134–144)
Total Protein: 7.1 g/dL (ref 6.0–8.5)
eGFR: 120 mL/min/{1.73_m2} (ref >59)

## 2021-05-23 LAB — CBC WITH DIFFERENTIAL/PLATELET
Basophils Absolute: 0 10*3/uL (ref 0.0–0.2)
Basos: 1 %
EOS (ABSOLUTE): 0.2 10*3/uL (ref 0.0–0.4)
Eos: 3 %
Hematocrit: 36.7 % (ref 34.0–46.6)
Hemoglobin: 12.1 g/dL (ref 11.1–15.9)
Immature Grans (Abs): 0 10*3/uL (ref 0.0–0.1)
Immature Granulocytes: 0 %
Lymphocytes Absolute: 1.9 10*3/uL (ref 0.7–3.1)
Lymphs: 29 %
MCH: 27.4 pg (ref 26.6–33.0)
MCHC: 33 g/dL (ref 31.5–35.7)
MCV: 83 fL (ref 79–97)
Monocytes Absolute: 0.4 10*3/uL (ref 0.1–0.9)
Monocytes: 6 %
Neutrophils Absolute: 4 10*3/uL (ref 1.4–7.0)
Neutrophils: 61 %
Platelets: 215 10*3/uL (ref 150–450)
RBC: 4.41 x10E6/uL (ref 3.77–5.28)
RDW: 13.3 % (ref 11.7–15.4)
WBC: 6.7 10*3/uL (ref 3.4–10.8)

## 2021-05-23 LAB — TSH: TSH: 1.61 u[IU]/mL (ref 0.450–4.500)

## 2021-05-25 ENCOUNTER — Other Ambulatory Visit: Payer: Self-pay | Admitting: Obstetrics and Gynecology

## 2021-05-25 DIAGNOSIS — N6452 Nipple discharge: Secondary | ICD-10-CM

## 2021-06-14 ENCOUNTER — Other Ambulatory Visit: Payer: Self-pay

## 2021-06-14 ENCOUNTER — Ambulatory Visit
Admission: RE | Admit: 2021-06-14 | Discharge: 2021-06-14 | Disposition: A | Discharge status: Home / Self Care | Payer: No Typology Code available for payment source | Source: Ambulatory Visit | Attending: Obstetrics and Gynecology | Admitting: Obstetrics and Gynecology

## 2021-06-14 ENCOUNTER — Ambulatory Visit
Admission: RE | Admit: 2021-06-14 | Discharge: 2021-06-14 | Disposition: A | Discharge status: Home / Self Care | Payer: Managed Care, Other (non HMO) | Source: Ambulatory Visit | Attending: Obstetrics and Gynecology | Admitting: Obstetrics and Gynecology

## 2021-06-14 DIAGNOSIS — N6452 Nipple discharge: Secondary | ICD-10-CM

## 2021-06-27 ENCOUNTER — Encounter: Payer: Self-pay | Admitting: Family Medicine

## 2021-06-27 ENCOUNTER — Ambulatory Visit (INDEPENDENT_AMBULATORY_CARE_PROVIDER_SITE_OTHER): Payer: No Typology Code available for payment source | Admitting: Family Medicine

## 2021-06-27 ENCOUNTER — Other Ambulatory Visit: Payer: Self-pay

## 2021-06-27 VITALS — BP 116/66 | HR 66 | Temp 96.8°F | Ht 62.0 in | Wt 112.0 lb

## 2021-06-27 DIAGNOSIS — R5383 Other fatigue: Secondary | ICD-10-CM

## 2021-06-27 DIAGNOSIS — F411 Generalized anxiety disorder: Secondary | ICD-10-CM | POA: Diagnosis not present

## 2021-06-27 DIAGNOSIS — F331 Major depressive disorder, recurrent, moderate: Secondary | ICD-10-CM

## 2021-06-27 NOTE — Patient Instructions (Signed)
Increase Trintillix to 20 mg daily Start Buspirone 5 mg three times per day. May try twice a day first.

## 2021-06-27 NOTE — Progress Notes (Signed)
Subjective:  Patient ID: Anna Oconnell, female    DOB: 20-Sep-1991  Age: 30 y.o. MRN: [id number]  Chief Complaint  Patient presents with   PHQ-9 4 Week Follow-up    HPI Anna comes in for a follow-up for depression. She has tried trintellix 10 mg daily states she does not feel any difference. C/o fatigue, hypersomonlence, depressed, racing thoughts, and difficulty concentrating, decreased sex drive and stressed. She had previously been on Zoloft and reported in was no longer helping. She does admit to still having a lot of stress at home. Current Outpatient Medications on File Prior to Visit  Medication Sig Dispense Refill   valACYclovir (VALTREX) 1000 MG tablet Take 2 tablets (2,000 mg total) by mouth 2 (two) times daily. Take at onset of cold sores x 1 day. 30 tablet 0   No current facility-administered medications on file prior to visit.   Past Medical History:  Diagnosis Date   Normal pregnancy, first 06/29/2014   S/P cesarean section 07/01/2014   Past Surgical History:  Procedure Laterality Date   CESAREAN SECTION N/A 07/01/2014   Procedure: CESAREAN SECTION;  Surgeon: Sherian Rein, MD;  Location: [facility];  Service: Obstetrics;  Laterality: N/A;    History reviewed. No pertinent family history. Social History   Socioeconomic History   Marital status: Married    Spouse name: Not on file   Number of children: 2   Years of education: Not on file   Highest education level: Not on file  Occupational History    Employer: [employer]  Tobacco Use   Smoking status: Never   Smokeless tobacco: Never  Vaping Use   Vaping Use: Never used  Substance and Sexual Activity   Alcohol use: Yes   Drug use: Not Currently   Sexual activity: Yes  Other Topics Concern   Not on file  Social History Narrative   Not on file   Social Determinants of Health   Financial Resource Strain: Not on file  Food Insecurity: Not on file  Transportation Needs: Not on file   Physical Activity: Not on file  Stress: Not on file  Social Connections: Not on file    Review of Systems  Constitutional:  Positive for fatigue. Negative for chills and fever.  HENT:  Negative for congestion, ear pain, postnasal drip, rhinorrhea, sinus pressure, sinus pain and sore throat.   Respiratory:  Negative for cough and shortness of breath.   Cardiovascular:  Negative for chest pain, palpitations and leg swelling.  Gastrointestinal:  Negative for abdominal pain, constipation, diarrhea and nausea.  Genitourinary:  Negative for difficulty urinating.  Neurological:  Positive for dizziness (at times- noticed when coming off Zoloft). Negative for headaches.  Psychiatric/Behavioral:  The patient is nervous/anxious.     Objective:  BP 116/66   Pulse 66   Temp (!) 96.8 F (36 C)   Ht 5\' 2"  (1.575 m)   Wt 112 lb (50.8 kg)   SpO2 98%   BMI 20.49 kg/m   BP/Weight 06/27/2021 05/22/2021 11/24/2020  Systolic BP 116 98 90  Diastolic BP 66 62 52  Wt. (Lbs) 112 111 119  BMI 20.49 20.3 21.77    Physical Exam Vitals reviewed.  Constitutional:      Appearance: Normal appearance.  HENT:     Head: Normocephalic.     Right Ear: Tympanic membrane, ear canal and external ear normal.     Left Ear: Tympanic membrane, ear canal and external ear normal.     Nose:  Nose normal.     Mouth/Throat:     Mouth: Mucous membranes are moist.  Neck:     Vascular: No carotid bruit.  Cardiovascular:     Rate and Rhythm: Normal rate and regular rhythm.     Pulses: Normal pulses.     Heart sounds: Normal heart sounds.  Pulmonary:     Effort: Pulmonary effort is normal.     Breath sounds: Normal breath sounds.  Abdominal:     General: Abdomen is flat. Bowel sounds are normal.     Palpations: Abdomen is soft.  Skin:    General: Skin is warm and dry.  Neurological:     General: No focal deficit present.     Mental Status: She is alert and oriented to person, place, and time. Mental status is at  baseline.  Psychiatric:        Mood and Affect: Mood is anxious.        Speech: Speech normal.        Behavior: Behavior normal. Behavior is cooperative.        Thought Content: Thought content normal.        Cognition and Memory: Cognition normal.        Judgment: Judgment normal.     Lab Results  Component Value Date   WBC 6.7 05/22/2021   HGB 12.1 05/22/2021   HCT 36.7 05/22/2021   PLT 215 05/22/2021   GLUCOSE 71 05/22/2021   ALT 9 05/22/2021   AST 13 05/22/2021   NA 140 05/22/2021   K 3.7 05/22/2021   CL 99 05/22/2021   CREATININE 0.67 05/22/2021   BUN 12 05/22/2021   CO2 23 05/22/2021   TSH 1.610 05/22/2021   Flowsheet Row Office Visit from 06/27/2021 in [facility]  PHQ-9 Total Score 16       GAD 7 : Generalized Anxiety Score 06/27/2021 05/22/2021  Nervous, Anxious, on Edge 3 3  Control/stop worrying 3 2  Worry too much - different things 3 2  Trouble relaxing 3 3  Restless 3 3  Easily annoyed or irritable 3 3  Afraid - awful might happen 1 0  Total GAD 7 Score 19 16  Anxiety Difficulty Somewhat difficult Somewhat difficult      Assessment & Plan:  1. Other fatigue - Not controlled - Increase Trintillix to 20 mg daily  2. GAD (generalized anxiety disorder) - Not controlled - Increase Trintillix to 20 mg daily - Start Buspirone 5 mg tid  3. Moderate recurrent major depression (HCC)  - Controlled - Increase Trintillix to 20 mg daily   Cyril Loosen, RN (NP student)  I attest that the above note was reviewed and is accurate. The patient was fully evaluated and examined by myself. I agree with plan.    Follow-up: Return in about 4 weeks (around 07/25/2021).  An After Visit Summary was printed and given to the patient.  Blane Ohara, MD [facility] [phone]

## 2021-06-27 NOTE — Progress Notes (Deleted)
Subjective:  Patient ID: Anna Oconnell, female    DOB: May 24, 1991  Age: 30 y.o. MRN: [id number]  Chief Complaint  Patient presents with   PHQ-9 4 Week Follow-up    HPI  Tried trintellix 10 mg daily states she does not feel any difference. C/o fatigue, hypersomonlence, depressed, racing thoughts, and difficulty concentrating, decreased sex drive and stressed. Current Outpatient Medications on File Prior to Visit  Medication Sig Dispense Refill   sertraline (ZOLOFT) 100 MG tablet TAKE 2 TABLETS BY MOUTH EVERY DAY 180 tablet 2   valACYclovir (VALTREX) 1000 MG tablet Take 2 tablets (2,000 mg total) by mouth 2 (two) times daily. Take at onset of cold sores x 1 day. 30 tablet 0   No current facility-administered medications on file prior to visit.   Past Medical History:  Diagnosis Date   Normal pregnancy, first 06/29/2014   S/P cesarean section 07/01/2014   Past Surgical History:  Procedure Laterality Date   CESAREAN SECTION N/A 07/01/2014   Procedure: CESAREAN SECTION;  Surgeon: Sherian Rein, MD;  Location: [facility];  Service: Obstetrics;  Laterality: N/A;    No family history on file. Social History   Socioeconomic History   Marital status: Married    Spouse name: Not on file   Number of children: 2   Years of education: Not on file   Highest education level: Not on file  Occupational History    Employer: [employer]  Tobacco Use   Smoking status: Never   Smokeless tobacco: Never  Vaping Use   Vaping Use: Never used  Substance and Sexual Activity   Alcohol use: Yes   Drug use: Not Currently   Sexual activity: Yes  Other Topics Concern   Not on file  Social History Narrative   Not on file   Social Determinants of Health   Financial Resource Strain: Not on file  Food Insecurity: Not on file  Transportation Needs: Not on file  Physical Activity: Not on file  Stress: Not on file  Social Connections: Not on file    Review of Systems   Constitutional:  Negative for chills, fatigue and fever.  HENT:  Negative for congestion, ear pain, postnasal drip, rhinorrhea, sinus pressure, sinus pain and sore throat.   Respiratory:  Negative for cough and shortness of breath.   Cardiovascular:  Negative for chest pain.  Neurological:  Positive for dizziness. Negative for headaches.  Psychiatric/Behavioral:  The patient is nervous/anxious.     Objective:  There were no vitals taken for this visit.  BP/Weight 05/22/2021 11/24/2020 08/08/2020  Systolic BP 98 90 122  Diastolic BP 62 52 72  Wt. (Lbs) 111 119 121  BMI 20.3 21.77 22.13    Physical Exam  Diabetic Foot Exam - Simple   No data filed      Lab Results  Component Value Date   WBC 6.7 05/22/2021   HGB 12.1 05/22/2021   HCT 36.7 05/22/2021   PLT 215 05/22/2021   GLUCOSE 71 05/22/2021   ALT 9 05/22/2021   AST 13 05/22/2021   NA 140 05/22/2021   K 3.7 05/22/2021   CL 99 05/22/2021   CREATININE 0.67 05/22/2021   BUN 12 05/22/2021   CO2 23 05/22/2021   TSH 1.610 05/22/2021      Assessment & Plan:   1. Other fatigue  2. GAD (generalized anxiety disorder)  3. Moderate recurrent major depression (HCC)  Increase Trintillix to 20 mg daily Start Buspirone 5 mg three times per  day. May try twice a day first.   No orders of the defined types were placed in this encounter.   No orders of the defined types were placed in this encounter.    Follow-up: No follow-ups on file.  An After Visit Summary was printed and given to the patient.  Blane Ohara, MD [facility] [phone]

## 2021-06-29 MED ORDER — BUSPIRONE HCL 5 MG PO TABS: 5.0000 mg | ORAL_TABLET | Freq: Three times a day (TID) | ORAL | 0 refills | Status: DC

## 2021-06-29 MED ORDER — VORTIOXETINE HBR 20 MG PO TABS: 20.0000 mg | ORAL_TABLET | Freq: Every day | ORAL | 0 refills | Status: DC

## 2021-07-26 ENCOUNTER — Other Ambulatory Visit: Payer: Self-pay | Admitting: Family Medicine

## 2021-07-27 ENCOUNTER — Ambulatory Visit: Payer: No Typology Code available for payment source | Admitting: Family Medicine

## 2021-08-01 ENCOUNTER — Ambulatory Visit (INDEPENDENT_AMBULATORY_CARE_PROVIDER_SITE_OTHER): Payer: No Typology Code available for payment source | Admitting: Family Medicine

## 2021-08-01 ENCOUNTER — Other Ambulatory Visit: Payer: Self-pay

## 2021-08-01 VITALS — BP 110/52 | HR 76 | Temp 98.0°F | Resp 14 | Wt 115.0 lb

## 2021-08-01 DIAGNOSIS — F411 Generalized anxiety disorder: Secondary | ICD-10-CM | POA: Diagnosis not present

## 2021-08-01 DIAGNOSIS — F330 Major depressive disorder, recurrent, mild: Secondary | ICD-10-CM

## 2021-08-01 MED ORDER — BUSPIRONE HCL 15 MG PO TABS: 15.0000 mg | ORAL_TABLET | Freq: Three times a day (TID) | ORAL | 0 refills | Status: DC

## 2021-08-01 MED ORDER — VORTIOXETINE HBR 20 MG PO TABS: 20.0000 mg | ORAL_TABLET | Freq: Every day | ORAL | 1 refills | Status: DC

## 2021-08-01 NOTE — Progress Notes (Signed)
Subjective:  Patient ID: Anna Oconnell, female    DOB: 02/21/1991  Age: 30 y.o. MRN: [id number]  Chief Complaint  Patient presents with   Anxiety    HPI Patient present for follow up of anxiety and depression. I increased trintillex to 20 mg daily. Pt is doing much better.  She is much less depressed but is still anxious.  PHQ9 SCORE ONLY 08/01/2021 06/27/2021 05/22/2021  PHQ-9 Total Score 6 16 12    GAD 7 : Generalized Anxiety Score 08/01/2021 06/27/2021 05/22/2021  Nervous, Anxious, on Edge 2 3 3   Control/stop worrying 1 3 2   Worry too much - different things 1 3 2   Trouble relaxing 3 3 3   Restless 2 3 3   Easily annoyed or irritable 2 3 3   Afraid - awful might happen 0 1 0  Total GAD 7 Score 11 19 16   Anxiety Difficulty - Somewhat difficult Somewhat difficult   Current Outpatient Medications on File Prior to Visit  Medication Sig Dispense Refill   busPIRone (BUSPAR) 5 MG tablet TAKE 1 TABLET BY MOUTH THREE TIMES A DAY 90 tablet 0   valACYclovir (VALTREX) 1000 MG tablet Take 2 tablets (2,000 mg total) by mouth 2 (two) times daily. Take at onset of cold sores x 1 day. 30 tablet 0   No current facility-administered medications on file prior to visit.   Past Medical History:  Diagnosis Date   Normal pregnancy, first 06/29/2014   S/P cesarean section 07/01/2014   Uterine scar from previous surgery affecting pregnancy 08/12/2021   Past Surgical History:  Procedure Laterality Date   CESAREAN SECTION N/A 07/01/2014   Procedure: CESAREAN SECTION;  Surgeon: , MD;  Location: [facility];  Service: Obstetrics;  Laterality: N/A;    History reviewed. No pertinent family history. Social History   Socioeconomic History   Marital status: Married    Spouse name: Not on file   Number of children: 2   Years of education: Not on file   Highest education level: Not on file  Occupational History    Employer: [employer]  Tobacco Use   Smoking status: Never    Smokeless tobacco: Never  Vaping Use   Vaping Use: Never used  Substance and Sexual Activity   Alcohol use: Yes   Drug use: Not Currently   Sexual activity: Yes  Other Topics Concern   Not on file  Social History Narrative   Not on file   Social Determinants of Health   Financial Resource Strain: Not on file  Food Insecurity: Not on file  Transportation Needs: Not on file  Physical Activity: Not on file  Stress: Not on file  Social Connections: Not on file    Review of Systems  Constitutional:  Negative for chills, fatigue and fever.  HENT:  Positive for congestion and rhinorrhea. Negative for sore throat.   Respiratory:  Negative for cough and shortness of breath.   Cardiovascular:  Negative for chest pain.  Gastrointestinal:  Negative for abdominal pain, constipation, diarrhea, nausea and vomiting.  Genitourinary:  Negative for dysuria and urgency.  Musculoskeletal:  Negative for myalgias.  Neurological:  Negative for dizziness, weakness, light-headedness and headaches.  Psychiatric/Behavioral:  Negative for dysphoric mood. The patient is not nervous/anxious.     Objective:  BP (!) 110/52   Pulse 76   Temp 98 F (36.7 C)   Resp 14   Wt 115 lb (52.2 kg)   BMI 21.03 kg/m   BP/Weight 08/01/2021 06/27/2021 05/22/2021  Systolic  BP 110 116 98  Diastolic BP 52 66 62  Wt. (Lbs) 115 112 111  BMI 21.03 20.49 20.3    Physical Exam Vitals reviewed.  Constitutional:      Appearance: Normal appearance. She is normal weight.  Neck:     Vascular: No carotid bruit.  Cardiovascular:     Rate and Rhythm: Normal rate and regular rhythm.     Heart sounds: Normal heart sounds.  Pulmonary:     Effort: Pulmonary effort is normal. No respiratory distress.     Breath sounds: Normal breath sounds.  Neurological:     Mental Status: She is alert and oriented to person, place, and time.  Psychiatric:        Mood and Affect: Mood normal.        Behavior: Behavior normal.     Diabetic Foot Exam - Simple   No data filed      Lab Results  Component Value Date   WBC 6.7 05/22/2021   HGB 12.1 05/22/2021   HCT 36.7 05/22/2021   PLT 215 05/22/2021   GLUCOSE 71 05/22/2021   ALT 9 05/22/2021   AST 13 05/22/2021   NA 140 05/22/2021   K 3.7 05/22/2021   CL 99 05/22/2021   CREATININE 0.67 05/22/2021   BUN 12 05/22/2021   CO2 23 05/22/2021   TSH 1.610 05/22/2021      Assessment & Plan:   Problem List Items Addressed This Visit       Other   GAD (generalized anxiety disorder) - Primary    Increase buspirone. Continue trintillex.       Relevant Medications   busPIRone (BUSPAR) 15 MG tablet   vortioxetine HBr (TRINTELLIX) 20 MG TABS tablet   Mild recurrent major depression (HCC)    Greatly improved.  Continue trintillex 20 mg once daily.       Relevant Medications   busPIRone (BUSPAR) 15 MG tablet   vortioxetine HBr (TRINTELLIX) 20 MG TABS tablet  .     Follow-up: Return in about 4 months (around 12/01/2021) for chronic follow up.  An After Visit Summary was printed and given to the patient.  Blane Ohara, MD [facility] [phone]

## 2021-08-12 ENCOUNTER — Encounter: Payer: Self-pay | Admitting: Family Medicine

## 2021-08-12 DIAGNOSIS — F330 Major depressive disorder, recurrent, mild: Secondary | ICD-10-CM | POA: Insufficient documentation

## 2021-08-12 DIAGNOSIS — O3429 Maternal care due to uterine scar from other previous surgery: Secondary | ICD-10-CM

## 2021-08-12 DIAGNOSIS — F411 Generalized anxiety disorder: Secondary | ICD-10-CM | POA: Insufficient documentation

## 2021-08-12 HISTORY — DX: Maternal care due to uterine scar from other previous surgery: O34.29

## 2021-08-12 NOTE — Assessment & Plan Note (Addendum)
Increase buspirone. Continue trintillex.

## 2021-08-12 NOTE — Assessment & Plan Note (Signed)
Greatly improved.  Continue trintillex 20 mg once daily.

## 2021-10-30 ENCOUNTER — Other Ambulatory Visit: Payer: Self-pay | Admitting: Family Medicine

## 2021-12-07 ENCOUNTER — Other Ambulatory Visit: Payer: Self-pay

## 2021-12-07 ENCOUNTER — Ambulatory Visit: Payer: No Typology Code available for payment source | Admitting: Nurse Practitioner

## 2021-12-07 VITALS — BP 110/68 | HR 86 | Temp 97.4°F | Ht 62.0 in | Wt 114.0 lb

## 2021-12-07 DIAGNOSIS — N3001 Acute cystitis with hematuria: Secondary | ICD-10-CM | POA: Diagnosis not present

## 2021-12-07 LAB — POCT URINALYSIS DIP (CLINITEK)
Bilirubin, UA: NEGATIVE
Glucose, UA: 100 mg/dL — AB
Ketones, POC UA: NEGATIVE mg/dL
Nitrite, UA: NEGATIVE
POC PROTEIN,UA: 30 — AB
Spec Grav, UA: >=1.03 — AB (ref 1.010–1.025)
Urobilinogen, UA: 0.2 E.U./dL
pH, UA: 6 (ref 5.0–8.0)

## 2021-12-07 MED ORDER — NITROFURANTOIN MONOHYD MACRO 100 MG PO CAPS: 100.0000 mg | ORAL_CAPSULE | Freq: Two times a day (BID) | ORAL | 0 refills | Status: DC

## 2021-12-07 NOTE — Progress Notes (Signed)
Acute Office Visit  Subjective:    Patient ID: Anna Oconnell, female    DOB: 02/04/1991, 31 y.o.   MRN: [id number]  Chief Complaint  Patient presents with   Urinary Tract Infection    HPI: Patient is in today for Urinary symptoms  She reports new onset hematuria, urinary frequency, and urinary urgency. The current episode started a few days ago and is worsening. Denies treatment for symptoms. Patient states symptoms are moderate in intensity, occurring constantly. She  has been recently treated for similar symptoms. Treated with a course of antibiotics 09/2021 for UTI.   Associated symptoms: No abdominal pain No back pain  No chills No constipation  No cramping No diarrhea  No discharge No fever  Yes hematuria No nausea  No vomiting      Past Medical History:  Diagnosis Date   Normal pregnancy, first 06/29/2014   S/P cesarean section 07/01/2014   Uterine scar from previous surgery affecting pregnancy 08/12/2021    Past Surgical History:  Procedure Laterality Date   CESAREAN SECTION N/A 07/01/2014   Procedure: CESAREAN SECTION;  Surgeon: Janyth Contes, MD;  Location: [facility];  Service: Obstetrics;  Laterality: N/A;    No family history on file.  Social History   Socioeconomic History   Marital status: Married    Spouse name: Not on file   Number of children: 2   Years of education: Not on file   Highest education level: Not on file  Occupational History    Employer: [employer]  Tobacco Use   Smoking status: Never   Smokeless tobacco: Never  Vaping Use   Vaping Use: Never used  Substance and Sexual Activity   Alcohol use: Yes   Drug use: Not Currently   Sexual activity: Yes  Other Topics Concern   Not on file  Social History Narrative   Not on file   Social Determinants of Health   Financial Resource Strain: Not on file  Food Insecurity: Not on file  Transportation Needs: Not on file  Physical Activity: Not on file  Stress: Not on  file  Social Connections: Not on file  Intimate Partner Violence: Not on file    Outpatient Medications Prior to Visit  Medication Sig Dispense Refill   busPIRone (BUSPAR) 15 MG tablet TAKE 1 TABLET BY MOUTH 3 TIMES DAILY. 270 tablet 0   valACYclovir (VALTREX) 1000 MG tablet Take 2 tablets (2,000 mg total) by mouth 2 (two) times daily. Take at onset of cold sores x 1 day. 30 tablet 0   vortioxetine HBr (TRINTELLIX) 20 MG TABS tablet Take 1 tablet (20 mg total) by mouth daily. 90 tablet 1   busPIRone (BUSPAR) 5 MG tablet TAKE 1 TABLET BY MOUTH THREE TIMES A DAY 90 tablet 0   No facility-administered medications prior to visit.    No Known Allergies  Review of Systems  Constitutional:  Negative for chills and fever.  HENT:  Negative for congestion and sore throat.   Respiratory:  Negative for cough and shortness of breath.   Cardiovascular:  Negative for chest pain.  Gastrointestinal:  Negative for abdominal pain, nausea and vomiting.  Genitourinary:  Positive for dysuria, frequency and urgency. Negative for flank pain.  Musculoskeletal:  Negative for back pain.      Objective:    Physical Exam Vitals reviewed.  Constitutional:      Appearance: Normal appearance.  Pulmonary:     Effort: Pulmonary effort is normal.  Skin:    General:  Skin is warm and dry.     Capillary Refill: Capillary refill takes less than 2 seconds.  Neurological:     General: No focal deficit present.     Mental Status: She is alert and oriented to person, place, and time.    BP 110/68    Pulse 86    Temp (!) 97.4 F (36.3 C)    Ht 5' 2"  (1.575 m)    Wt 114 lb (51.7 kg)    LMP 11/15/2021 (Approximate)    SpO2 98%    BMI 20.85 kg/m  Wt Readings from Last 3 Encounters:  12/07/21 114 lb (51.7 kg)  08/01/21 115 lb (52.2 kg)  06/27/21 112 lb (50.8 kg)    Health Maintenance Due  Topic Date Due   Hepatitis C Screening  Never done   PAP SMEAR-Modifier  Never done   INFLUENZA VACCINE  Never done        Lab Results  Component Value Date   TSH 1.610 05/22/2021   Lab Results  Component Value Date   WBC 6.7 05/22/2021   HGB 12.1 05/22/2021   HCT 36.7 05/22/2021   MCV 83 05/22/2021   PLT 215 05/22/2021   Lab Results  Component Value Date   NA 140 05/22/2021   K 3.7 05/22/2021   CO2 23 05/22/2021   GLUCOSE 71 05/22/2021   BUN 12 05/22/2021   CREATININE 0.67 05/22/2021   BILITOT 0.3 05/22/2021   ALKPHOS 84 05/22/2021   AST 13 05/22/2021   ALT 9 05/22/2021   PROT 7.1 05/22/2021   ALBUMIN 4.6 05/22/2021   CALCIUM 9.3 05/22/2021   EGFR 120 05/22/2021       Assessment & Plan:    1. Acute cystitis with hematuria - POCT URINALYSIS DIP (CLINITEK) - Urine Culture - nitrofurantoin, macrocrystal-monohydrate, (MACROBID) 100 MG capsule; Take 1 capsule (100 mg total) by mouth 2 (two) times daily.  Dispense: 14 capsule; Refill: 0    Take Macrobid twice daily for 7 days Push fluids Avoid holding urine Void immediately after intimacy    Follow-up: PRN  An After Visit Summary was printed and given to the patient.  Rip Harbour, NP [place] [phone]

## 2021-12-07 NOTE — Patient Instructions (Addendum)
Take Macrobid twice daily for 7 days Push fluids Avoid holding urine Void immediately after intimacy  Urinary Tract Infection, Adult A urinary tract infection (UTI) is an infection of any part of the urinary tract. The urinary tract includes: The kidneys. The ureters. The bladder. The urethra. These organs make, store, and get rid of pee (urine) in the body. What are the causes? This infection is caused by germs (bacteria) in your genital area. These germs grow and cause swelling (inflammation) of your urinary tract. What increases the risk? The following factors may make you more likely to develop this condition: Using a small, thin tube (catheter) to drain pee. Not being able to control when you pee or poop (incontinence). Being female. If you are female, these things can increase the risk: Using these methods to prevent pregnancy: A medicine that kills sperm (spermicide). A device that blocks sperm (diaphragm). Having low levels of a female hormone (estrogen). Being pregnant. You are more likely to develop this condition if: You have genes that add to your risk. You are sexually active. You take antibiotic medicines. You have trouble peeing because of: A prostate that is bigger than normal, if you are female. A blockage in the part of your body that drains pee from the bladder. A kidney stone. A nerve condition that affects your bladder. Not getting enough to drink. Not peeing often enough. You have other conditions, such as: Diabetes. A weak disease-fighting system (immune system). Sickle cell disease. Gout. Injury of the spine. What are the signs or symptoms? Symptoms of this condition include: Needing to pee right away. Peeing small amounts often. Pain or burning when peeing. Blood in the pee. Pee that smells bad or not like normal. Trouble peeing. Pee that is cloudy. Fluid coming from the vagina, if you are female. Pain in the belly or lower back. Other  symptoms include: Vomiting. Not feeling hungry. Feeling mixed up (confused). This may be the first symptom in older adults. Being tired and grouchy (irritable). A fever. Watery poop (diarrhea). How is this treated? Taking antibiotic medicine. Taking other medicines. Drinking enough water. In some cases, you may need to see a specialist. Follow these instructions at home: Medicines Take over-the-counter and prescription medicines only as told by your doctor. If you were prescribed an antibiotic medicine, take it as told by your doctor. Do not stop taking it even if you start to feel better. General instructions Make sure you: Pee until your bladder is empty. Do not hold pee for a long time. Empty your bladder after sex. Wipe from front to back after peeing or pooping if you are a female. Use each tissue one time when you wipe. Drink enough fluid to keep your pee pale yellow. Keep all follow-up visits. Contact a doctor if: You do not get better after 1-2 days. Your symptoms go away and then come back. Get help right away if: You have very bad back pain. You have very bad pain in your lower belly. You have a fever. You have chills. You feeling like you will vomit or you vomit. Summary A urinary tract infection (UTI) is an infection of any part of the urinary tract. This condition is caused by germs in your genital area. There are many risk factors for a UTI. Treatment includes antibiotic medicines. Drink enough fluid to keep your pee pale yellow. This information is not intended to replace advice given to you by your health care provider. Make sure you discuss any questions you  have with your health care provider. Document Revised: 06/09/2020 Document Reviewed: 06/09/2020 Elsevier Patient Education  2022 [employer].

## 2021-12-09 ENCOUNTER — Encounter: Payer: Self-pay | Admitting: Nurse Practitioner

## 2021-12-09 LAB — URINE CULTURE

## 2022-02-01 ENCOUNTER — Other Ambulatory Visit: Payer: Self-pay | Admitting: Family Medicine

## 2022-03-05 ENCOUNTER — Other Ambulatory Visit: Payer: Self-pay | Admitting: Family Medicine

## 2022-04-11 ENCOUNTER — Encounter: Payer: Self-pay | Admitting: Family Medicine

## 2022-04-11 ENCOUNTER — Ambulatory Visit: Payer: No Typology Code available for payment source | Admitting: Family Medicine

## 2022-04-11 VITALS — BP 100/40 | HR 72 | Temp 97.6°F | Resp 16 | Ht 62.0 in | Wt 114.0 lb

## 2022-04-11 DIAGNOSIS — N3001 Acute cystitis with hematuria: Secondary | ICD-10-CM | POA: Diagnosis not present

## 2022-04-11 LAB — POCT URINALYSIS DIPSTICK
Bilirubin, UA: NEGATIVE
Glucose, UA: NEGATIVE
Ketones, UA: POSITIVE
Nitrite, UA: NEGATIVE
Protein, UA: POSITIVE — AB
Spec Grav, UA: >=1.03 — AB (ref 1.010–1.025)
Urobilinogen, UA: 1 E.U./dL
pH, UA: 5.5 (ref 5.0–8.0)

## 2022-04-11 MED ORDER — CIPROFLOXACIN HCL 500 MG PO TABS: 500.0000 mg | ORAL_TABLET | Freq: Two times a day (BID) | ORAL | 0 refills | Status: AC

## 2022-04-11 MED ORDER — PHENAZOPYRIDINE HCL 200 MG PO TABS: 200.0000 mg | ORAL_TABLET | Freq: Three times a day (TID) | ORAL | 0 refills | Status: DC | PRN

## 2022-04-11 NOTE — Progress Notes (Signed)
Acute Office Visit  Subjective:    Patient ID: Anna Oconnell, female    DOB: [birth year], 31 y.o.   MRN: [id number]  Chief Complaint  Patient presents with   Dysuria   Hematuria    HPI: Patient is in today for urinary frequency and burning which started 24 hours ago.   Past Medical History:  Diagnosis Date   Normal pregnancy, first 06/29/2014   S/P cesarean section 07/01/2014   Uterine scar from previous surgery affecting pregnancy 08/12/2021    Past Surgical History:  Procedure Laterality Date   CESAREAN SECTION N/A 07/01/2014   Procedure: CESAREAN SECTION;  Surgeon: Janyth Contes, MD;  Location: [facility];  Service: Obstetrics;  Laterality: N/A;    History reviewed. No pertinent family history.  Social History   Socioeconomic History   Marital status: Married    Spouse name: Not on file   Number of children: 2   Years of education: Not on file   Highest education level: Not on file  Occupational History    Employer: [employer]  Tobacco Use   Smoking status: Never   Smokeless tobacco: Never  Vaping Use   Vaping Use: Never used  Substance and Sexual Activity   Alcohol use: Yes   Drug use: Not Currently   Sexual activity: Yes  Other Topics Concern   Not on file  Social History Narrative   Not on file   Social Determinants of Health   Financial Resource Strain: Not on file  Food Insecurity: Not on file  Transportation Needs: Not on file  Physical Activity: Not on file  Stress: Not on file  Social Connections: Not on file  Intimate Partner Violence: Not on file    Outpatient Medications Prior to Visit  Medication Sig Dispense Refill   TRINTELLIX 20 MG TABS tablet TAKE 1 TABLET BY MOUTH EVERY DAY 90 tablet 1   valACYclovir (VALTREX) 1000 MG tablet Take 2 tablets (2,000 mg total) by mouth 2 (two) times daily. Take at onset of cold sores x 1 day. 30 tablet 0   busPIRone (BUSPAR) 15 MG tablet TAKE 1 TABLET BY MOUTH THREE TIMES A DAY 270 tablet 0    nitrofurantoin, macrocrystal-monohydrate, (MACROBID) 100 MG capsule Take 1 capsule (100 mg total) by mouth 2 (two) times daily. 14 capsule 0   No facility-administered medications prior to visit.    No Known Allergies  Review of Systems  Constitutional:  Negative for chills and fever.  HENT:  Negative for tinnitus.   Genitourinary:  Positive for dysuria, frequency and urgency.  Musculoskeletal:  Negative for back pain.      Objective:    Physical Exam Vitals reviewed.  Constitutional:      Appearance: Normal appearance. She is normal weight.  Neck:     Vascular: No carotid bruit.  Cardiovascular:     Rate and Rhythm: Normal rate and regular rhythm.     Heart sounds: Normal heart sounds.  Pulmonary:     Effort: Pulmonary effort is normal. No respiratory distress.     Breath sounds: Normal breath sounds.  Abdominal:     General: Abdomen is flat. Bowel sounds are normal.     Palpations: Abdomen is soft.     Tenderness: There is no abdominal tenderness.  Neurological:     Mental Status: She is alert and oriented to person, place, and time.  Psychiatric:        Mood and Affect: Mood normal.  Behavior: Behavior normal.    BP (!) 100/40   Pulse 72   Temp 97.6 F (36.4 C)   Resp 16   Ht 5' 2"  (1.575 m)   Wt 114 lb (51.7 kg)   BMI 20.85 kg/m  Wt Readings from Last 3 Encounters:  04/11/22 114 lb (51.7 kg)  12/07/21 114 lb (51.7 kg)  08/01/21 115 lb (52.2 kg)    Health Maintenance Due  Topic Date Due   Hepatitis C Screening  Never done   PAP SMEAR-Modifier  Never done    There are no preventive care reminders to display for this patient.   Lab Results  Component Value Date   TSH 1.610 05/22/2021   Lab Results  Component Value Date   WBC 6.7 05/22/2021   HGB 12.1 05/22/2021   HCT 36.7 05/22/2021   MCV 83 05/22/2021   PLT 215 05/22/2021   Lab Results  Component Value Date   NA 140 05/22/2021   K 3.7 05/22/2021   CO2 23 05/22/2021   GLUCOSE  71 05/22/2021   BUN 12 05/22/2021   CREATININE 0.67 05/22/2021   BILITOT 0.3 05/22/2021   ALKPHOS 84 05/22/2021   AST 13 05/22/2021   ALT 9 05/22/2021   PROT 7.1 05/22/2021   ALBUMIN 4.6 05/22/2021   CALCIUM 9.3 05/22/2021   EGFR 120 05/22/2021   No results found for: CHOL No results found for: HDL No results found for: LDLCALC No results found for: TRIG No results found for: CHOLHDL No results found for: HGBA1C     Assessment & Plan:   Problem List Items Addressed This Visit       Genitourinary   Acute cystitis with hematuria - Primary    Cipro 500 mg twice daily for 1 week. Pyridium 200 mg 3 times daily as needed burning or bladder spasms.       Relevant Medications   ciprofloxacin (CIPRO) 500 MG tablet   phenazopyridine (PYRIDIUM) 200 MG tablet   Other Relevant Orders   Urine Culture   POCT urinalysis dipstick (Completed)   Meds ordered this encounter  Medications   ciprofloxacin (CIPRO) 500 MG tablet    Sig: Take 1 tablet (500 mg total) by mouth 2 (two) times daily for 7 days.    Dispense:  14 tablet    Refill:  0   phenazopyridine (PYRIDIUM) 200 MG tablet    Sig: Take 1 tablet (200 mg total) by mouth 3 (three) times daily as needed for pain.    Dispense:  15 tablet    Refill:  0    Orders Placed This Encounter  Procedures   Urine Culture   POCT urinalysis dipstick     Follow-up: No follow-ups on file.  An After Visit Summary was printed and given to the patient.  Rochel Brome, MD [facility] [phone]

## 2022-04-11 NOTE — Assessment & Plan Note (Signed)
Cipro 500 mg twice daily for 1 week. Pyridium 200 mg 3 times daily as needed burning or bladder spasms.

## 2022-04-12 LAB — URINE CULTURE

## 2022-04-16 ENCOUNTER — Telehealth: Payer: Self-pay

## 2022-04-16 NOTE — Telephone Encounter (Signed)
Anna Oconnell called to report that she will complete her antibiotic tomorrow and she continues to have burning discomfort w/urination.  Dr. Sedalia Muta advised that she come back in for follow-up since her urine culture was inconclusive.

## 2022-04-19 ENCOUNTER — Ambulatory Visit: Payer: No Typology Code available for payment source | Admitting: Family Medicine

## 2022-04-19 VITALS — BP 104/40 | HR 60 | Temp 97.4°F | Resp 14 | Ht 62.0 in | Wt 113.0 lb

## 2022-04-19 DIAGNOSIS — R300 Dysuria: Secondary | ICD-10-CM | POA: Diagnosis not present

## 2022-04-19 LAB — POCT URINALYSIS DIPSTICK
Glucose, UA: NEGATIVE
Ketones, UA: NEGATIVE
Nitrite, UA: NEGATIVE
Protein, UA: POSITIVE — AB
Spec Grav, UA: >=1.03 — AB (ref 1.010–1.025)
Urobilinogen, UA: NEGATIVE E.U./dL — AB
pH, UA: 5.5 (ref 5.0–8.0)

## 2022-04-19 NOTE — Progress Notes (Unsigned)
Subjective:  Patient ID: Anna Oconnell, female    DOB: May 02, 1991  Age: 30 y.o. MRN: [id number]  Chief Complaint  Patient presents with   Dysuria    HPI Patient presents for follow-up of dysuria.  Patient was treated for possible bladder infection 1 to 2 weeks ago with Cipro.  Her symptoms did not fully resolved.  Her urine culture showed less than 10,000 CFU's.  Denies any vaginal discharge or itching.  She says the dysuria can occur randomly and not necessarily when she is urinating.  She also has problems with bladder incontinence.  Does not necessarily urge or stress incontinence.  She will be going up the stairs and she will just have a little bit of leakage.  She denies any exposure to STDs.  She is in a monogamous relationship married to her husband for 15 years.  Current Outpatient Medications on File Prior to Visit  Medication Sig Dispense Refill   busPIRone (BUSPAR) 15 MG tablet TAKE 1 TABLET BY MOUTH THREE TIMES A DAY 270 tablet 0   TRINTELLIX 20 MG TABS tablet TAKE 1 TABLET BY MOUTH EVERY DAY 90 tablet 1   valACYclovir (VALTREX) 1000 MG tablet Take 2 tablets (2,000 mg total) by mouth 2 (two) times daily. Take at onset of cold sores x 1 day. 30 tablet 0   No current facility-administered medications on file prior to visit.   Past Medical History:  Diagnosis Date   Normal pregnancy, first 06/29/2014   S/P cesarean section 07/01/2014   Uterine scar from previous surgery affecting pregnancy 08/12/2021   Past Surgical History:  Procedure Laterality Date   CESAREAN SECTION N/A 07/01/2014   Procedure: CESAREAN SECTION;  Surgeon: Sherian Rein, MD;  Location: [facility];  Service: Obstetrics;  Laterality: N/A;    No family history on file. Social History   Socioeconomic History   Marital status: Married    Spouse name: Not on file   Number of children: 2   Years of education: Not on file   Highest education level: Not on file  Occupational History    Employer: [employer]  Tobacco Use   Smoking status: Never   Smokeless tobacco: Never  Vaping Use   Vaping Use: Never used  Substance and Sexual Activity   Alcohol use: Yes   Drug use: Not Currently   Sexual activity: Yes  Other Topics Concern   Not on file  Social History Narrative   Not on file   Social Determinants of Health   Financial Resource Strain: Not on file  Food Insecurity: Not on file  Transportation Needs: Not on file  Physical Activity: Not on file  Stress: Not on file  Social Connections: Not on file    Review of Systems  Constitutional:  Negative for chills, fatigue and fever.  HENT:  Negative for congestion, ear pain and sore throat.   Respiratory:  Negative for cough and shortness of breath.   Cardiovascular:  Negative for chest pain and palpitations.  Gastrointestinal:  Negative for abdominal pain, constipation, diarrhea, nausea and vomiting.  Endocrine: Negative for polydipsia, polyphagia and polyuria.  Genitourinary:  Positive for dysuria. Negative for difficulty urinating.  Musculoskeletal:  Negative for arthralgias, back pain and myalgias.  Skin:  Negative for rash.  Neurological:  Negative for headaches.  Psychiatric/Behavioral:  Negative for dysphoric mood. The patient is not nervous/anxious.      Objective:  BP (!) 104/40   Pulse 60   Temp (!) 97.4 F (36.3 C)  Resp 14   Ht 5\' 2"  (1.575 m)   Wt 113 lb (51.3 kg)   BMI 20.67 kg/m      04/19/2022    9:59 AM 04/11/2022   10:56 AM 12/07/2021    9:26 AM  BP/Weight  Systolic BP 104 100 110  Diastolic BP 40 40 68  Wt. (Lbs) 113 114 114  BMI 20.67 kg/m2 20.85 kg/m2 20.85 kg/m2    Physical Exam  Diabetic Foot Exam - Simple   No data filed      Lab Results  Component Value Date   WBC 6.7 05/22/2021   HGB 12.1 05/22/2021   HCT 36.7 05/22/2021   PLT 215 05/22/2021   GLUCOSE 71 05/22/2021   ALT 9 05/22/2021   AST 13 05/22/2021   NA 140 05/22/2021   K 3.7 05/22/2021   CL 99 05/22/2021    CREATININE 0.67 05/22/2021   BUN 12 05/22/2021   CO2 23 05/22/2021   TSH 1.610 05/22/2021      Assessment & Plan:   Problem List Items Addressed This Visit   None Visit Diagnoses     Dysuria    -  Primary   Relevant Orders   POCT urinalysis dipstick     .  No orders of the defined types were placed in this encounter.   Orders Placed This Encounter  Procedures   POCT urinalysis dipstick    I,Marla I Leal-Borjas,acting as a scribe for 07/23/2021, MD.,have documented all relevant documentation on the behalf of Blane Ohara, MD,as directed by  Blane Ohara, MD while in the presence of Blane Ohara, MD.   Follow-up: No follow-ups on file.  An After Visit Summary was printed and given to the patient.  Blane Ohara, MD [facility] [phone]

## 2022-04-25 DIAGNOSIS — R300 Dysuria: Secondary | ICD-10-CM | POA: Insufficient documentation

## 2022-04-25 NOTE — Assessment & Plan Note (Addendum)
Check UA. Suspicious interstitial cystitis,  Patient was given education and eating plan for interstitial cystitis.

## 2022-04-25 NOTE — Assessment & Plan Note (Signed)
Diet plan was given to patient.

## 2022-04-28 ENCOUNTER — Encounter: Payer: Self-pay | Admitting: Family Medicine

## 2022-05-06 ENCOUNTER — Other Ambulatory Visit: Payer: Self-pay | Admitting: Family Medicine

## 2022-07-09 ENCOUNTER — Other Ambulatory Visit: Payer: Self-pay | Admitting: Family Medicine

## 2022-07-09 ENCOUNTER — Telehealth: Payer: Self-pay

## 2022-07-09 DIAGNOSIS — R3129 Other microscopic hematuria: Secondary | ICD-10-CM

## 2022-07-09 NOTE — Telephone Encounter (Signed)
Done. Dr. Lindie Roberson  

## 2022-07-09 NOTE — Telephone Encounter (Signed)
Patient is having hematuria, lower back pain, dysuria; states last time she had it was mentioned if occurred again may need referral. Patient was last seen in June. Attempted to schedule appt with Dr Sedalia Muta, full schedule for next week.   Secure chat sent to Dr Sedalia Muta regarding this. Dr Sedalia Muta recommends patient be evaluated at Urgent Care and urology referral will be started by our office. Patient was made aware and she will be evaluated at urgent care. She does not have urology office preference but would like to stay in [place] location.   Lorita Officer, [place] 07/09/22 10:38 AM

## 2022-07-16 ENCOUNTER — Encounter: Payer: Self-pay | Admitting: Family Medicine

## 2022-08-05 ENCOUNTER — Other Ambulatory Visit: Payer: Self-pay | Admitting: Family Medicine

## 2022-10-22 ENCOUNTER — Ambulatory Visit: Payer: No Typology Code available for payment source | Admitting: Nurse Practitioner

## 2022-10-22 ENCOUNTER — Encounter: Payer: Self-pay | Admitting: Nurse Practitioner

## 2022-10-22 VITALS — BP 112/68 | HR 90 | Temp 97.2°F | Ht 62.0 in | Wt 116.0 lb

## 2022-10-22 DIAGNOSIS — Z20818 Contact with and (suspected) exposure to other bacterial communicable diseases: Secondary | ICD-10-CM | POA: Diagnosis not present

## 2022-10-22 DIAGNOSIS — J029 Acute pharyngitis, unspecified: Secondary | ICD-10-CM | POA: Diagnosis not present

## 2022-10-22 DIAGNOSIS — J028 Acute pharyngitis due to other specified organisms: Secondary | ICD-10-CM | POA: Diagnosis not present

## 2022-10-22 LAB — POCT INFLUENZA A/B
Influenza A, POC: NEGATIVE
Influenza B, POC: NEGATIVE

## 2022-10-22 LAB — POCT RAPID STREP A (OFFICE): Rapid Strep A Screen: NEGATIVE

## 2022-10-22 LAB — POC COVID19 BINAXNOW: SARS Coronavirus 2 Ag: NEGATIVE

## 2022-10-22 MED ORDER — AZITHROMYCIN 250 MG PO TABS: ORAL_TABLET | ORAL | 0 refills | Status: AC

## 2022-10-22 NOTE — Progress Notes (Signed)
Acute Office Visit  Subjective:    Patient ID: Anna Oconnell, female    DOB: 1991-06-16, 31 y.o.   MRN: [id number]  Chief Complaint  Patient presents with   Sore Throat    HPI: Patient is in today for Upper respiratory symptoms She complains of achiness, non productive cough, post nasal drip, and sore throat. Denies fever, chills, night sweats or rash. Onset of symptoms was yesterday and worsening.She is drinking plenty of fluids.  Past history is significant for no history of pneumonia or bronchitis. Patient is non-smoker. Child positive for strep. Has planned trip to [facility] this weekend.   Past Medical History:  Diagnosis Date   Normal pregnancy, first 06/29/2014   S/P cesarean section 07/01/2014   Uterine scar from previous surgery affecting pregnancy 08/12/2021    Past Surgical History:  Procedure Laterality Date   CESAREAN SECTION N/A 07/01/2014   Procedure: CESAREAN SECTION;  Surgeon: Janyth Contes, MD;  Location: [facility];  Service: Obstetrics;  Laterality: N/A;    No family history on file.  Social History   Socioeconomic History   Marital status: Married    Spouse name: Not on file   Number of children: 2   Years of education: Not on file   Highest education level: Not on file  Occupational History    Employer: [employer]  Tobacco Use   Smoking status: Never   Smokeless tobacco: Never  Vaping Use   Vaping Use: Never used  Substance and Sexual Activity   Alcohol use: Yes   Drug use: Not Currently   Sexual activity: Yes  Other Topics Concern   Not on file  Social History Narrative   Not on file   Social Determinants of Health   Financial Resource Strain: Not on file  Food Insecurity: Not on file  Transportation Needs: Not on file  Physical Activity: Not on file  Stress: Not on file  Social Connections: Not on file  Intimate Partner Violence: Not on file    Outpatient Medications Prior to Visit  Medication Sig Dispense Refill    busPIRone (BUSPAR) 15 MG tablet TAKE 1 TABLET BY MOUTH THREE TIMES A DAY 270 tablet 1   TRINTELLIX 20 MG TABS tablet TAKE 1 TABLET BY MOUTH EVERY DAY 90 tablet 1   valACYclovir (VALTREX) 1000 MG tablet Take 2 tablets (2,000 mg total) by mouth 2 (two) times daily. Take at onset of cold sores x 1 day. 30 tablet 0   No facility-administered medications prior to visit.    No Known Allergies  Review of Systems See pertinent positives and negatives per HPI.     Objective:    Physical Exam Vitals reviewed.  HENT:     Right Ear: Tympanic membrane normal.     Left Ear: Tympanic membrane normal. No swelling.     Nose: No congestion or rhinorrhea.     Mouth/Throat:     Pharynx: Posterior oropharyngeal erythema present.     Tonsils: No tonsillar exudate or tonsillar abscesses.  Lymphadenopathy:     Cervical: No cervical adenopathy.  Neurological:     Mental Status: She is alert.     BP 112/68   Pulse 90   Temp (!) 97.2 F (36.2 C)   Ht _0  (1.575 m)   Wt 116 lb (52.6 kg)   SpO2 98%   BMI 21.22 kg/m   Wt Readings from Last 3 Encounters:  04/19/22 113 lb (51.3 kg)  04/11/22 114 lb (51.7 kg)  12/07/21  114 lb (51.7 kg)    Health Maintenance Due  Topic Date Due   Hepatitis C Screening  Never done   INFLUENZA VACCINE  Never done       Lab Results  Component Value Date   TSH 1.610 05/22/2021   Lab Results  Component Value Date   WBC 6.7 05/22/2021   HGB 12.1 05/22/2021   HCT 36.7 05/22/2021   MCV 83 05/22/2021   PLT 215 05/22/2021   Lab Results  Component Value Date   NA 140 05/22/2021   K 3.7 05/22/2021   CO2 23 05/22/2021   GLUCOSE 71 05/22/2021   BUN 12 05/22/2021   CREATININE 0.67 05/22/2021   BILITOT 0.3 05/22/2021   ALKPHOS 84 05/22/2021   AST 13 05/22/2021   ALT 9 05/22/2021   PROT 7.1 05/22/2021   ALBUMIN 4.6 05/22/2021   CALCIUM 9.3 05/22/2021   EGFR 120 05/22/2021      Assessment & Plan:   1. Pharyngitis due to other organism -  azithromycin (ZITHROMAX) 250 MG tablet; Take 2 tablets on day 1, then 1 tablet daily on days 2 through 5  Dispense: 6 tablet; Refill: 0  2. Exposure to strep throat - azithromycin (ZITHROMAX) 250 MG tablet; Take 2 tablets on day 1, then 1 tablet daily on days 2 through 5  Dispense: 6 tablet; Refill: 0  3. Sore throat - POC COVID-19 BinaxNow-NEGATIVE - POCT Influenza A/B-NEGATIVE - POCT Rapid Strep A-NEGATIVE - POCT rapid strep A-NEGATIVE    Warm salt water gargles Replace toothbrush and toothpaste Rest and push fluids Take Z-pack as directed, with food Follow-up as needed      Follow-up: PRN  An After Visit Summary was printed and given to the patient.  I, Rip Harbour, NP, have reviewed all documentation for this visit. The documentation on 10/22/22 for the exam, diagnosis, procedures, and orders are all accurate and complete.    Signed, Rip Harbour, NP [place] [phone]

## 2022-10-22 NOTE — Patient Instructions (Signed)
Warm salt water gargles Replace toothbrush and toothpaste Rest and push fluids Take Z-pack as directed, with food Follow-up as needed   Pharyngitis  Pharyngitis is a sore throat (pharynx). This is when there is redness, pain, and swelling in your throat. Most of the time, this condition gets better on its own. In some cases, you may need medicine. What are the causes? An infection from a virus. An infection from bacteria. Allergies. What increases the risk? Being 5-24 years old. Being in crowded environments. These include: Daycares. Schools. Dormitories. Living in a place with cold temperatures outside. Having a weakened disease-fighting (immune) system. What are the signs or symptoms? Symptoms may vary depending on the cause. Common symptoms include: Sore throat. Tiredness (fatigue). Low-grade fever. Stuffy nose. Cough. Headache. Other symptoms may include: Glands in the neck (lymph nodes) that are swollen. Skin rashes. Film on the throat or tonsils. This can be caused by an infection from bacteria. Vomiting. Red, itchy eyes. Loss of appetite. Joint pain and muscle aches. Tonsils that are temporarily bigger than usual (enlarged). How is this treated? Many times, treatment is not needed. This condition usually gets better in 3-4 days without treatment. If the infection is caused by a bacteria, you may be need to take antibiotics. Follow these instructions at home: Medicines Take over-the-counter and prescription medicines only as told by your doctor. If you were prescribed an antibiotic medicine, take it as told by your doctor. Do not stop taking the antibiotic even if you start to feel better. Use throat lozenges or sprays to soothe your throat as told by your doctor. Children can get pharyngitis. Do not give your child aspirin. Managing pain To help with pain, try: Sipping warm liquids, such as: Broth. Herbal tea. Warm water. Eating or drinking cold or frozen  liquids, such as frozen ice pops. Rinsing your mouth (gargle) with a salt water mixture 3-4 times a day or as needed. To make salt water, dissolve -1 tsp (3-6 g) of salt in 1 cup (237 mL) of warm water. Do not swallow this mixture. Sucking on hard candy or throat lozenges. Putting a cool-mist humidifier in your bedroom at night to moisten the air. Sitting in the bathroom with the door closed for 5-10 minutes while you run hot water in the shower.  General instructions  Do not smoke or use any products that contain nicotine or tobacco. If you need help quitting, ask your doctor. Rest as told by your doctor. Drink enough fluid to keep your pee (urine) pale yellow. How is this prevented? Wash your hands often for at least 20 seconds with soap and water. If soap and water are not available, use hand sanitizer. Do not touch your eyes, nose, or mouth with unwashed hands. Wash hands after touching these areas. Do not share cups or eating utensils. Avoid close contact with people who are sick. Contact a doctor if: You have large, tender lumps in your neck. You have a rash. You cough up green, yellow-brown, or bloody spit. Get help right away if: You have a stiff neck. You drool or cannot swallow liquids. You cannot drink or take medicines without vomiting. You have very bad pain that does not go away with medicine. You have problems breathing, and it is not from a stuffy nose. You have new pain and swelling in your knees, ankles, wrists, or elbows. These symptoms may be an emergency. Get help right away. Call your local emergency services (911 in the [place] Do not wait   to see if the symptoms will go away. Do not drive yourself to the hospital. Summary Pharyngitis is a sore throat (pharynx). This is when there is redness, pain, and swelling in your throat. Most of the time, pharyngitis gets better on its own. Sometimes, you may need medicine. If you were prescribed an antibiotic medicine,  take it as told by your doctor. Do not stop taking the antibiotic even if you start to feel better. This information is not intended to replace advice given to you by your health care provider. Make sure you discuss any questions you have with your health care provider. Document Revised: 01/24/2021 Document Reviewed: 01/24/2021 Elsevier Patient Education  2023 Elsevier Inc.  

## 2022-12-09 IMAGING — MG DIGITAL DIAGNOSTIC BILAT W/ TOMO W/ CAD
8 of 17 series · 8 of 40 positions shown · non-contrast
Comparison: None.

CLINICAL DATA: Bilateral greenish non spontaneous nipple discharge.



[L ML]
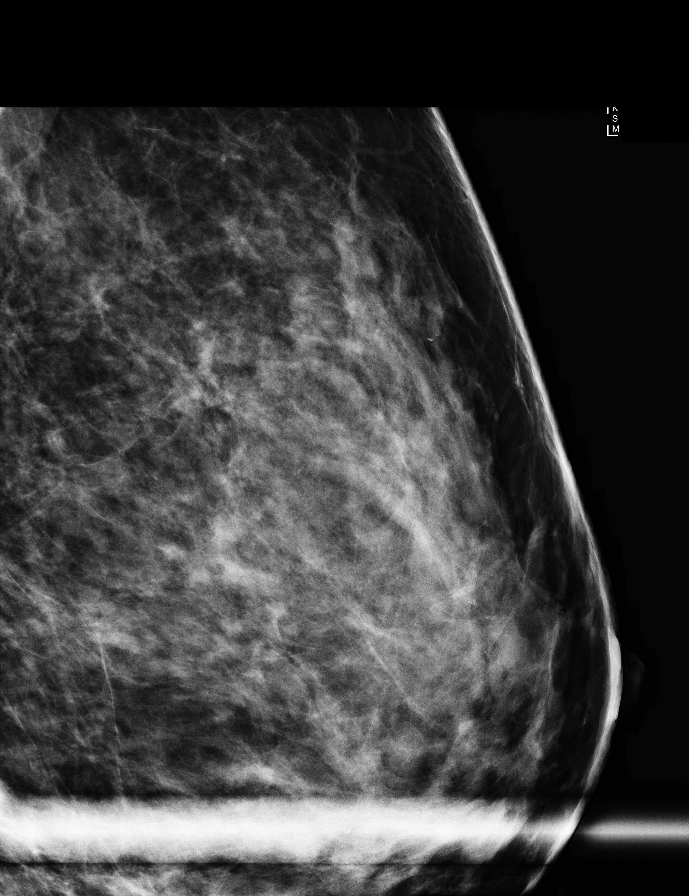

[L CC]
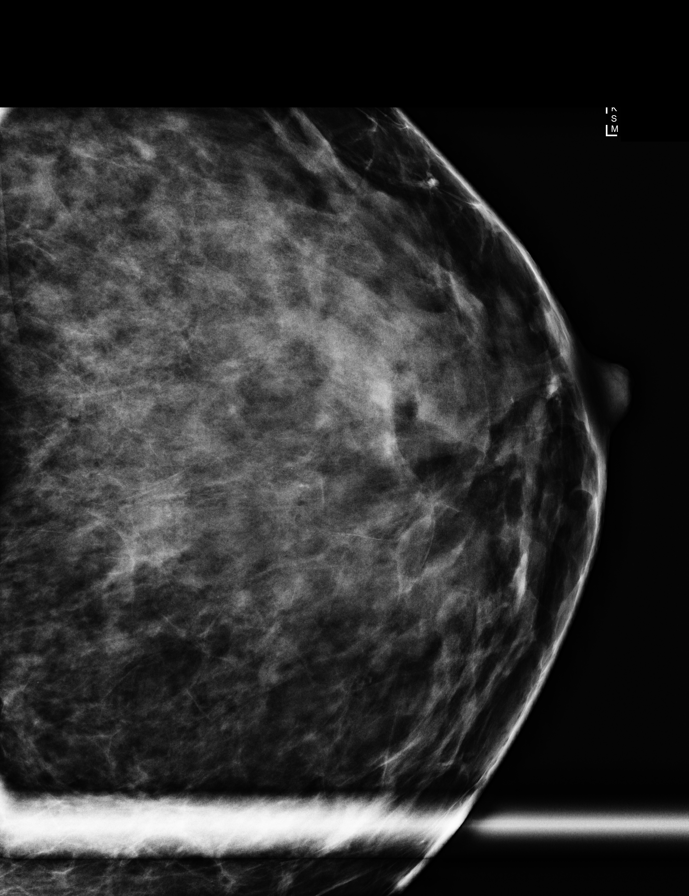

[R CC synth-2D (1 of 2)]
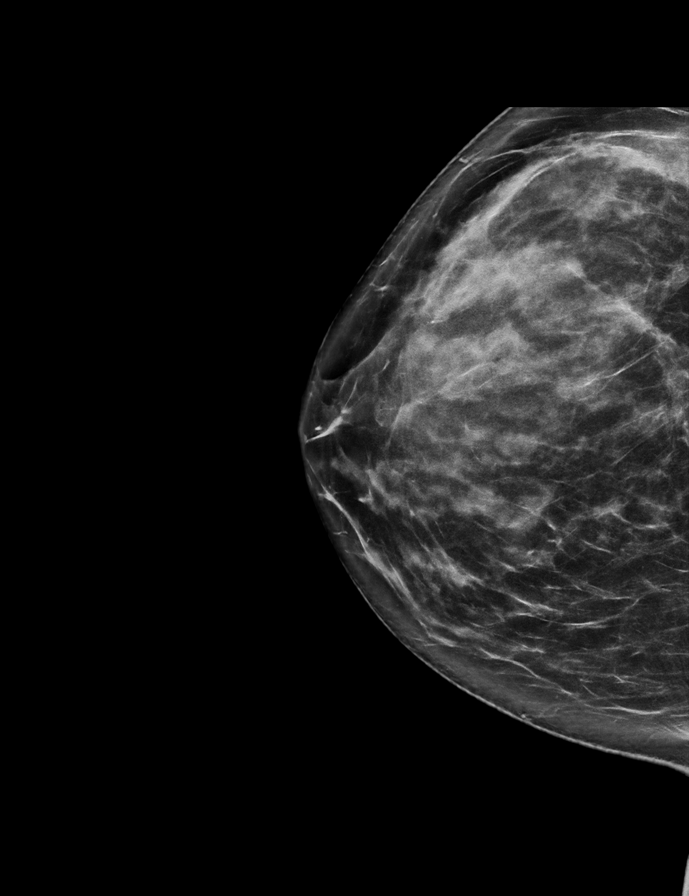

[L MLO synth-2D (1 of 2)]
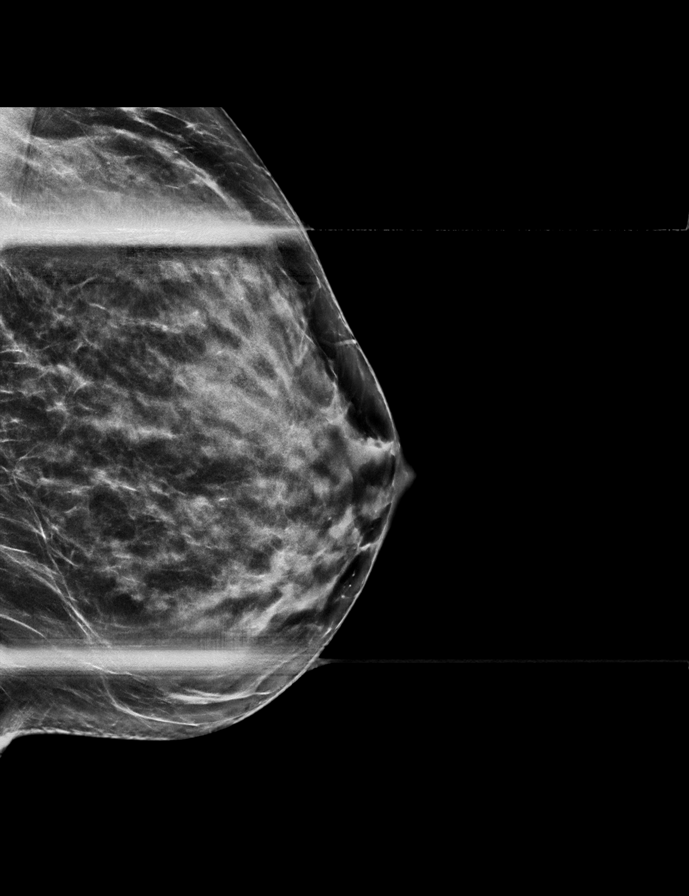

[R MLO synth-2D (1 of 2)]
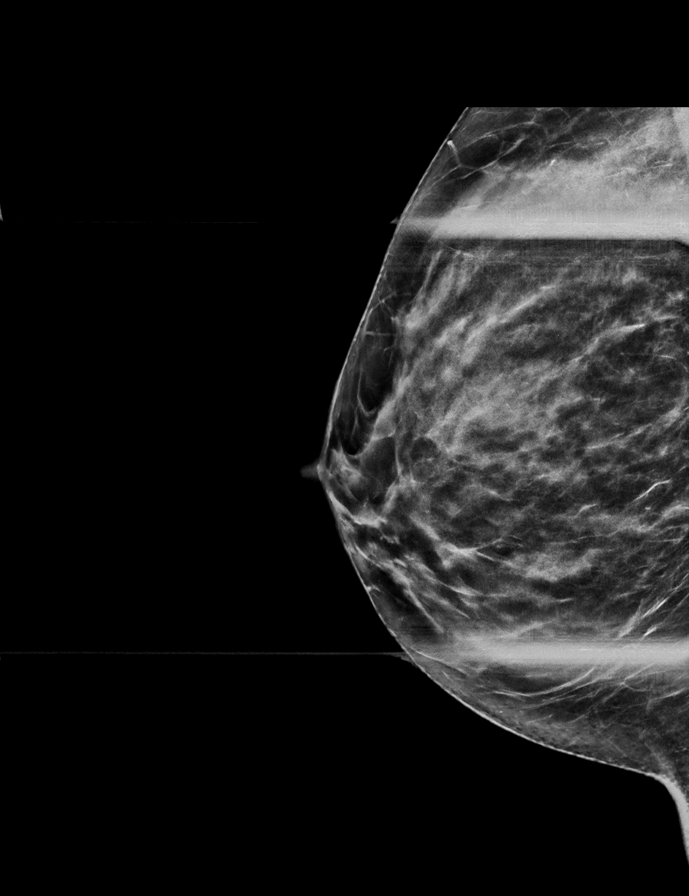

[L MLO synth-2D (2 of 2)]
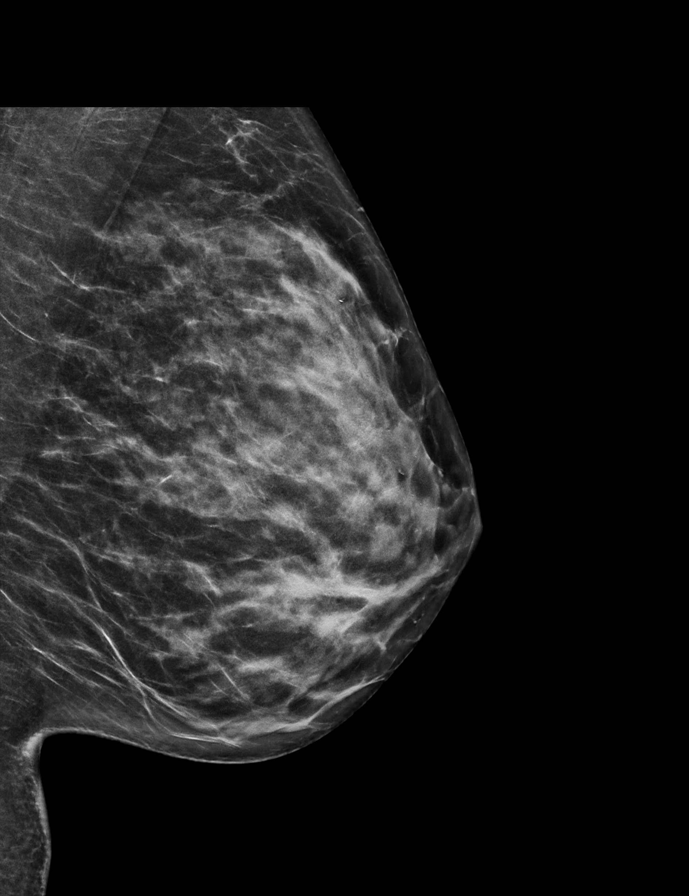

[R CC synth-2D (2 of 2)]
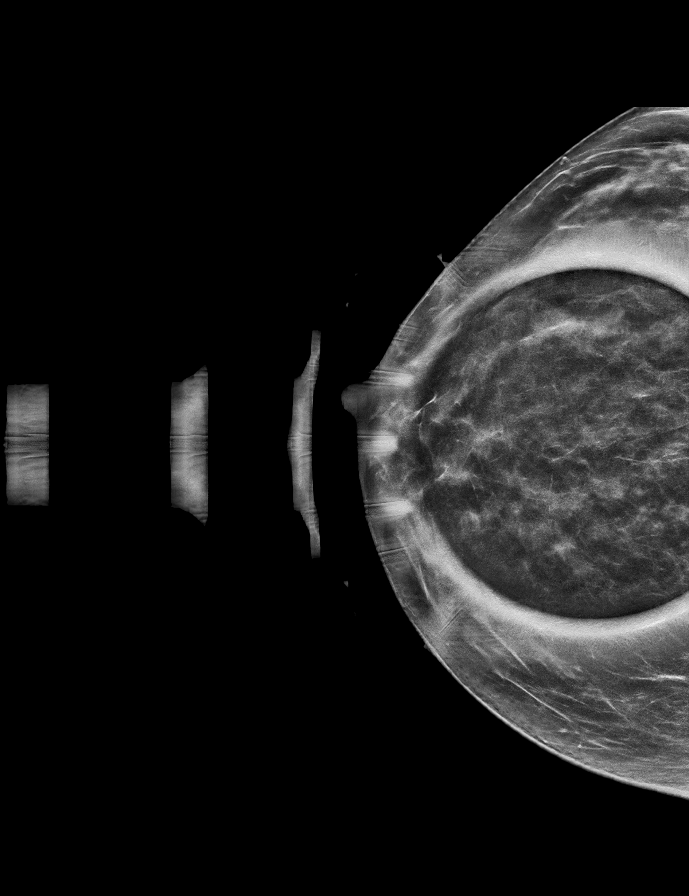

[R MLO synth-2D (2 of 2)]
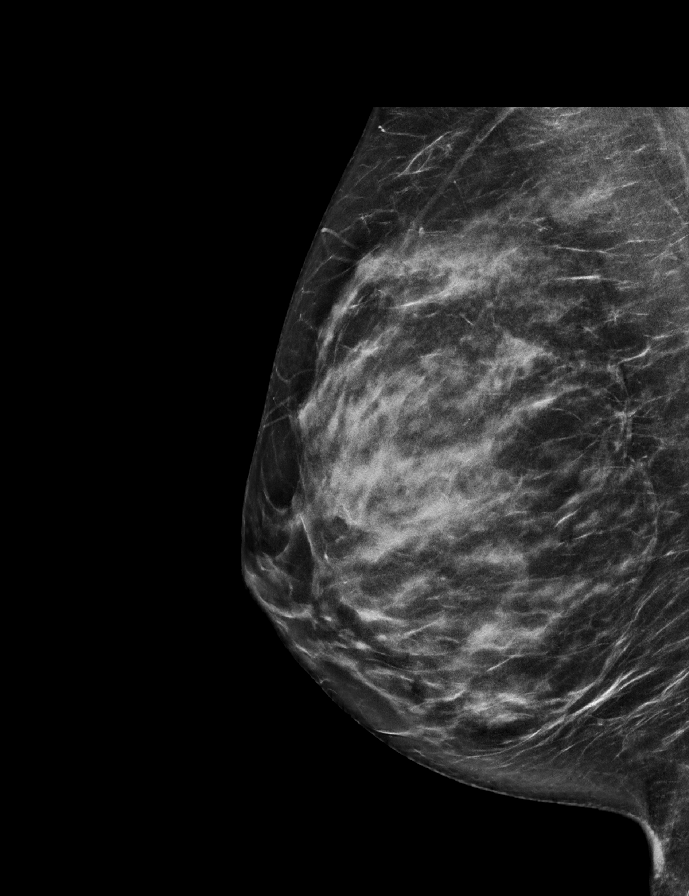

[8 of 40 positions shown; findings below may reference images not displayed]

ACR Breast Density Category c: The breast tissue is heterogeneously
dense, which may obscure small masses.
FINDINGS: Asymmetries on the right resolve on additional imaging. The patient
has scattered benign appearing calcifications on the left. No
suspicious masses, calcifications, or distortion are seen in either
breast.

On physical exam, no suspicious lumps are identified.

Targeted ultrasound is performed, showing several scattered cysts
but no intraductal masses. No cause for the patient's nipple
discharge identified.
IMPRESSION: Fibrocystic changes. No evidence of malignancy. No cause for nipple
discharge identified.

RECOMMENDATION:
The pattern of the patient's nipple discharge is most suggestive of
fibrocystic change and would not be typical for a papilloma or
malignancy. Recommend annual screening mammography beginning at the
age of 40.

I have discussed the findings and recommendations with the patient.
If applicable, a reminder letter will be sent to the patient
regarding the next appointment.

BI-RADS CATEGORY  2: Benign.

## 2023-01-09 ENCOUNTER — Other Ambulatory Visit: Payer: Self-pay | Admitting: Family Medicine

## 2023-01-09 MED ORDER — VALACYCLOVIR HCL 1 G PO TABS: 2000.0000 mg | ORAL_TABLET | Freq: Two times a day (BID) | ORAL | 0 refills | Status: DC

## 2023-01-15 ENCOUNTER — Other Ambulatory Visit: Payer: Self-pay | Admitting: Family Medicine

## 2023-01-19 ENCOUNTER — Other Ambulatory Visit: Payer: Self-pay | Admitting: Family Medicine

## 2023-01-27 ENCOUNTER — Telehealth: Payer: Self-pay

## 2023-01-27 NOTE — Telephone Encounter (Signed)
PA has been sent for Trintellix to patient's insurance.

## 2023-02-12 ENCOUNTER — Telehealth: Payer: Self-pay

## 2023-02-12 NOTE — Telephone Encounter (Signed)
Patient called and stated even thought we have gotten her Trintellix approved through her insurance that the cost even with copay card was going to be 150$. Patient can't afford this, she states the only other medication she has tried is Sertraline other than the Trintellix. Please advise.

## 2023-02-13 ENCOUNTER — Other Ambulatory Visit: Payer: Self-pay

## 2023-02-13 MED ORDER — ESCITALOPRAM OXALATE 10 MG PO TABS: 10.0000 mg | ORAL_TABLET | Freq: Every day | ORAL | 2 refills | Status: DC

## 2023-02-13 NOTE — Telephone Encounter (Signed)
Patient informed, sending to [facility].

## 2023-05-16 ENCOUNTER — Other Ambulatory Visit: Payer: Self-pay | Admitting: Family Medicine

## 2023-06-20 ENCOUNTER — Other Ambulatory Visit: Payer: Self-pay | Admitting: Family Medicine

## 2023-06-30 ENCOUNTER — Other Ambulatory Visit: Payer: Self-pay | Admitting: Family Medicine

## 2023-06-30 MED ORDER — ESCITALOPRAM OXALATE 10 MG PO TABS: 10.0000 mg | ORAL_TABLET | Freq: Every day | ORAL | 0 refills | Status: DC

## 2023-07-30 ENCOUNTER — Other Ambulatory Visit: Payer: Self-pay | Admitting: Family Medicine

## 2023-07-31 ENCOUNTER — Telehealth: Payer: Self-pay

## 2023-07-31 NOTE — Telephone Encounter (Addendum)
Called patient left message to call office and schedule appointment  ----- Message from Blane Ohara sent at 07/30/2023  9:49 PM EDT ----- Regarding: needs appt Please call to set up an appointment with one of our providers. Needs an appt scheduled before a refill is sent. Dr. Sedalia Muta

## 2023-09-02 ENCOUNTER — Other Ambulatory Visit: Payer: Self-pay | Admitting: Family Medicine

## 2023-09-02 MED ORDER — VALACYCLOVIR HCL 1 G PO TABS: 2000.0000 mg | ORAL_TABLET | Freq: Two times a day (BID) | ORAL | 0 refills | Status: AC

## 2023-09-03 ENCOUNTER — Other Ambulatory Visit: Payer: Self-pay

## 2023-09-23 ENCOUNTER — Other Ambulatory Visit: Payer: Self-pay | Admitting: Family Medicine

## 2023-09-24 MED ORDER — ESCITALOPRAM OXALATE 10 MG PO TABS: 10.0000 mg | ORAL_TABLET | Freq: Every day | ORAL | 0 refills | Status: DC

## 2023-09-30 ENCOUNTER — Ambulatory Visit: Payer: BC Managed Care – PPO | Admitting: Family Medicine

## 2023-09-30 ENCOUNTER — Encounter: Payer: Self-pay | Admitting: Family Medicine

## 2023-09-30 VITALS — BP 110/62 | HR 66 | Temp 98.0°F | Resp 16 | Ht 62.0 in | Wt 117.2 lb

## 2023-09-30 DIAGNOSIS — F411 Generalized anxiety disorder: Secondary | ICD-10-CM | POA: Diagnosis not present

## 2023-09-30 DIAGNOSIS — F330 Major depressive disorder, recurrent, mild: Secondary | ICD-10-CM

## 2023-09-30 NOTE — Assessment & Plan Note (Signed)
Stable - Patient currently taking Lexapro 10 mg by mouth once daily

## 2023-09-30 NOTE — Assessment & Plan Note (Signed)
Not well controlled - Buspirone 15 mg by mouth once daily most days. Patient has not been taking it THREE TIMES A DAY as prescribed. When she does she does not notice a difference. - not taking trintillex due to insurance not covering - Referral to Yvette Rack, NP @ [facility] for evaluation

## 2023-09-30 NOTE — Progress Notes (Signed)
Subjective:  Patient ID: Anna Oconnell, female    DOB: 01/14/1991  Age: 32 y.o. MRN: [id number]  Chief Complaint  Patient presents with   Medical Management of Chronic Issues    HPI Anxiety- Currently taking Buspar 15 mg by mouth once a day, states that she is not compliant with taking this THREE TIMES A DAY as prescribed. Patient states that she doesn't feel much difference at all. She has tried Xanax and didn't like it because she gets sleepy and she has to work and take care of her family. She does want something stronger. Her anxiety is better but she still has trouble relaxing, easily irritated and restless.  Depression - Currently taking Lexapro 10 mg by mouth once daily. Patient states that her depression is better with this medication and she can tell when she doesn't take it. She states that she doesn't like the side effects of weight gain and low libido. She was prescribed another medication Trintellix but her insurance wouldn't cover it.       09/30/2023    8:35 AM 08/01/2021   11:25 AM 06/27/2021   11:57 AM 05/22/2021    2:20 PM 08/08/2020    9:04 AM  Depression screen PHQ 2/9  Decreased Interest 1 1 1 3  0  Down, Depressed, Hopeless 1 0 2 1 0  PHQ - 2 Score 2 1 3 4  0  Altered sleeping 1 1 3 2    Tired, decreased energy 2 2 3 3    Change in appetite 0 0 0 0   Feeling bad or failure about yourself  1 1 2 1    Trouble concentrating 1 1 3 2    Moving slowly or fidgety/restless 0 0 2 0   Suicidal thoughts 0 0 0 0   PHQ-9 Score 7 6 16 12    Difficult doing work/chores   Very difficult Somewhat difficult         09/30/2023    8:37 AM 08/01/2021   11:22 AM 06/27/2021   11:14 AM 05/22/2021    2:24 PM  GAD 7 : Generalized Anxiety Score  Nervous, Anxious, on Edge 2 2 3 3   Control/stop worrying 1 1 3 2   Worry too much - different things 1 1 3 2   Trouble relaxing 3 3 3 3   Restless 1 2 3 3   Easily annoyed or irritable 2 2 3 3   Afraid - awful might happen 0 0 1 0  Total GAD 7  Score 10 11 19 16   Anxiety Difficulty   Somewhat difficult Somewhat difficult        12/07/2021    9:29 AM  Fall Risk   Falls in the past year? 0  Number falls in past yr: 0  Injury with Fall? 0  Risk for fall due to : No Fall Risks  Follow up Falls evaluation completed    Patient Care Team: Blane Ohara, MD as PCP - General (Family Medicine)   Review of Systems  Constitutional:  Negative for chills, diaphoresis, fatigue and fever.  HENT:  Negative for congestion, ear pain and sinus pain.   Respiratory:  Negative for cough and shortness of breath.   Cardiovascular:  Negative for chest pain.  Gastrointestinal:  Negative for abdominal pain, constipation, nausea and vomiting.  Genitourinary:  Negative for dysuria, frequency and urgency.  Musculoskeletal:  Negative for arthralgias and myalgias.  Neurological:  Negative for weakness and headaches.  Psychiatric/Behavioral:  Negative for dysphoric mood. The patient is not nervous/anxious.  Current Outpatient Medications on File Prior to Visit  Medication Sig Dispense Refill   busPIRone (BUSPAR) 15 MG tablet TAKE 1 TABLET BY MOUTH THREE TIMES A DAY 270 tablet 1   escitalopram (LEXAPRO) 10 MG tablet Take 1 tablet (10 mg total) by mouth daily for 7 days. 7 tablet 0   valACYclovir (VALTREX) 1000 MG tablet Take 2 tablets (2,000 mg total) by mouth 2 (two) times daily. Take at onset of cold sores x 1 day. 30 tablet 0   No current facility-administered medications on file prior to visit.   Past Medical History:  Diagnosis Date   Normal pregnancy, first 06/29/2014   S/P cesarean section 07/01/2014   Uterine scar from previous surgery affecting pregnancy 08/12/2021   Past Surgical History:  Procedure Laterality Date   CESAREAN SECTION N/A 07/01/2014   Procedure: CESAREAN SECTION;  Surgeon: Sherian Rein, MD;  Location: [facility];  Service: Obstetrics;  Laterality: N/A;    History reviewed. No pertinent family history. Social  History   Socioeconomic History   Marital status: Married    Spouse name: Not on file   Number of children: 2   Years of education: Not on file   Highest education level: Not on file  Occupational History    Employer: [employer]  Tobacco Use   Smoking status: Never   Smokeless tobacco: Never  Vaping Use   Vaping status: Never Used  Substance and Sexual Activity   Alcohol use: Yes   Drug use: Not Currently   Sexual activity: Yes  Other Topics Concern   Not on file  Social History Narrative   Not on file   Social Determinants of Health   Financial Resource Strain: Low Risk  (09/30/2023)   Overall Financial Resource Strain (CARDIA)    Difficulty of Paying Living Expenses: Not hard at all  Food Insecurity: No Food Insecurity (09/30/2023)   Hunger Vital Sign    Worried About Running Out of Food in the Last Year: Never true    Ran Out of Food in the Last Year: Never true  Transportation Needs: No Transportation Needs (09/30/2023)   PRAPARE - Administrator, Civil Service (Medical): No    Lack of Transportation (Non-Medical): No  Physical Activity: Insufficiently Active (09/30/2023)   Exercise Vital Sign    Days of Exercise per Week: 3 days    Minutes of Exercise per Session: 20 min  Stress: No Stress Concern Present (09/30/2023)   [employer] of Occupational Health - Occupational Stress Questionnaire    Feeling of Stress : Not at all  Social Connections: Not on file    Objective:  BP 110/62   Pulse 66   Temp 98 F (36.7 C)   Resp 16   Ht 5\' 2"  (1.575 m)   Wt 117 lb 3.2 oz (53.2 kg)   SpO2 96%   BMI 21.44 kg/m      09/30/2023    8:07 AM 10/22/2022    8:34 AM 04/19/2022    9:59 AM  BP/Weight  Systolic BP 110 112 104  Diastolic BP 62 68 40  Wt. (Lbs) 117.2 116 113  BMI 21.44 kg/m2 21.22 kg/m2 20.67 kg/m2    Physical Exam Constitutional:      General: She is not in acute distress.    Appearance: Normal appearance.  HENT:     Head:  Normocephalic.  Eyes:     Conjunctiva/sclera: Conjunctivae normal.  Cardiovascular:     Rate and Rhythm: Normal rate  and regular rhythm.     Heart sounds: Normal heart sounds.  Pulmonary:     Effort: Pulmonary effort is normal.     Breath sounds: Normal breath sounds.  Abdominal:     General: Bowel sounds are normal.     Palpations: Abdomen is soft.     Tenderness: There is no abdominal tenderness.  Skin:    General: Skin is warm.  Neurological:     Mental Status: She is alert. Mental status is at baseline.  Psychiatric:        Mood and Affect: Mood normal.        Behavior: Behavior normal.      Lab Results  Component Value Date   WBC 6.7 05/22/2021   HGB 12.1 05/22/2021   HCT 36.7 05/22/2021   PLT 215 05/22/2021   GLUCOSE 71 05/22/2021   ALT 9 05/22/2021   AST 13 05/22/2021   NA 140 05/22/2021   K 3.7 05/22/2021   CL 99 05/22/2021   CREATININE 0.67 05/22/2021   BUN 12 05/22/2021   CO2 23 05/22/2021   TSH 1.610 05/22/2021      Assessment & Plan:    GAD (generalized anxiety disorder) Assessment & Plan: Not well controlled - Buspirone 15 mg by mouth once daily most days. Patient has not been taking it THREE TIMES A DAY as prescribed. When she does she does not notice a difference. - not taking trintillex due to insurance not covering - Referral to Yvette Rack, NP @ [facility] for evaluation  Orders: -     Ambulatory referral to Psychiatry  Mild recurrent major depression (HCC) Assessment & Plan: Stable - Patient currently taking Lexapro 10 mg by mouth once daily       No orders of the defined types were placed in this encounter.   Orders Placed This Encounter  Procedures   Ambulatory referral to Psychiatry     Follow-up: Return if symptoms worsen or fail to improve.   Madelynn Done Smith,acting as a Neurosurgeon for Renne Crigler, FNP.,have documented all relevant documentation on the behalf of Renne Crigler, FNP,as directed by  Renne Crigler, FNP  while in the presence of Renne Crigler, FNP.   An After Visit Summary was printed and given to the patient.  Total time spent on today's visit was greater than 30 minutes, including both face-to-face time and nonface-to-face time personally spent on review of chart (labs and imaging), discussing labs and goals, discussing further work-up, treatment options, referrals to specialist if needed, reviewing outside records if pertinent, answering patient's questions, and coordinating care.   Lajuana Matte, FNP [facility] [phone]

## 2023-10-02 ENCOUNTER — Other Ambulatory Visit: Payer: Self-pay

## 2023-10-02 MED ORDER — ESCITALOPRAM OXALATE 10 MG PO TABS: 10.0000 mg | ORAL_TABLET | Freq: Every day | ORAL | 0 refills | Status: DC

## 2023-10-29 ENCOUNTER — Other Ambulatory Visit: Payer: Self-pay

## 2023-11-26 ENCOUNTER — Other Ambulatory Visit: Payer: Self-pay

## 2023-12-23 ENCOUNTER — Other Ambulatory Visit: Payer: Self-pay | Admitting: Family Medicine

## 2024-09-19 ENCOUNTER — Other Ambulatory Visit: Payer: Self-pay | Admitting: Family Medicine

## 2024-09-23 ENCOUNTER — Other Ambulatory Visit: Payer: Self-pay | Admitting: Family Medicine

## 2024-09-23 MED ORDER — BUSPIRONE HCL 15 MG PO TABS: 15.0000 mg | ORAL_TABLET | Freq: Three times a day (TID) | ORAL | 1 refills | Status: AC

## 2024-09-23 NOTE — Telephone Encounter (Signed)
 Copied from CRM #[id number]. Topic: Clinical - Medication Refill >> Sep 23, 2024  1:38 PM Berwyn MATSU wrote: Medication: busPIRone  (BUSPAR ) 15 MG tablet   Has the patient contacted their pharmacy? Yes (Agent: If no, request that the patient contact the pharmacy for the refill. If patient does not wish to contact the pharmacy document the reason why and proceed with request.) (Agent: If yes, when and what did the pharmacy advise?)  This is the patient's preferred pharmacy:  [id number] - [place], [place] - [address] [facility]  [address] Phone: [phone] Fax: [phone]  Is this the correct pharmacy for this prescription? Yes If no, delete pharmacy and type the correct one.   Has the prescription been filled recently? Yes  Is the patient out of the medication? Yes  Has the patient been seen for an appointment in the last year OR does the patient have an upcoming appointment? No  Can we respond through MyChart? Yes  Agent: Please be advised that Rx refills may take up to 3 business days. We ask that you follow-up with your pharmacy.

## 2024-10-24 ENCOUNTER — Other Ambulatory Visit: Payer: Self-pay | Admitting: Family Medicine
